# Patient Record
Sex: Female | Born: 1945 | Race: White | Hispanic: No | Marital: Married | Smoking: Never smoker
Health system: Southern US, Community
[De-identification: ages and names within clinical notes are randomized; demographics above are authoritative.]

## PROBLEM LIST (undated history)

## (undated) DIAGNOSIS — H409 Unspecified glaucoma: Secondary | ICD-10-CM

## (undated) DIAGNOSIS — C73 Malignant neoplasm of thyroid gland: Secondary | ICD-10-CM

## (undated) DIAGNOSIS — D649 Anemia, unspecified: Secondary | ICD-10-CM

## (undated) DIAGNOSIS — M1990 Unspecified osteoarthritis, unspecified site: Secondary | ICD-10-CM

## (undated) DIAGNOSIS — I10 Essential (primary) hypertension: Secondary | ICD-10-CM

## (undated) DIAGNOSIS — E039 Hypothyroidism, unspecified: Secondary | ICD-10-CM

## (undated) DIAGNOSIS — M5430 Sciatica, unspecified side: Secondary | ICD-10-CM

## (undated) HISTORY — DX: Sciatica, unspecified side: M54.30

## (undated) HISTORY — PX: TOTAL THYROIDECTOMY: SHX2547

## (undated) HISTORY — PX: OTHER SURGICAL HISTORY: SHX169

## (undated) HISTORY — DX: Unspecified glaucoma: H40.9

## (undated) HISTORY — DX: Anemia, unspecified: D64.9

## (undated) HISTORY — PX: ORTHOPEDIC SURGERY: SHX850

## (undated) HISTORY — PX: APPENDECTOMY: SHX54

---

## 1999-10-14 ENCOUNTER — Other Ambulatory Visit: Admission: RE | Admit: 1999-10-14 | Discharge: 1999-10-14 | Payer: Self-pay | Admitting: Obstetrics and Gynecology

## 2000-01-09 ENCOUNTER — Encounter: Payer: Self-pay | Admitting: Obstetrics and Gynecology

## 2000-01-09 ENCOUNTER — Encounter: Admission: RE | Admit: 2000-01-09 | Discharge: 2000-01-09 | Payer: Self-pay | Admitting: Obstetrics and Gynecology

## 2000-05-27 ENCOUNTER — Emergency Department (HOSPITAL_COMMUNITY): Admission: EM | Admit: 2000-05-27 | Discharge: 2000-05-27 | Payer: Self-pay | Admitting: Emergency Medicine

## 2000-06-09 ENCOUNTER — Emergency Department (HOSPITAL_COMMUNITY): Admission: EM | Admit: 2000-06-09 | Discharge: 2000-06-09 | Payer: Self-pay | Admitting: Emergency Medicine

## 2000-10-08 ENCOUNTER — Encounter: Payer: Self-pay | Admitting: Emergency Medicine

## 2000-10-08 ENCOUNTER — Emergency Department (HOSPITAL_COMMUNITY): Admission: EM | Admit: 2000-10-08 | Discharge: 2000-10-08 | Payer: Self-pay | Admitting: *Deleted

## 2000-10-14 ENCOUNTER — Other Ambulatory Visit: Admission: RE | Admit: 2000-10-14 | Discharge: 2000-10-14 | Payer: Self-pay | Admitting: Obstetrics and Gynecology

## 2001-04-10 ENCOUNTER — Emergency Department (HOSPITAL_COMMUNITY): Admission: EM | Admit: 2001-04-10 | Discharge: 2001-04-11 | Payer: Self-pay | Admitting: Emergency Medicine

## 2001-04-24 ENCOUNTER — Emergency Department (HOSPITAL_COMMUNITY): Admission: EM | Admit: 2001-04-24 | Discharge: 2001-04-24 | Payer: Self-pay | Admitting: Emergency Medicine

## 2001-07-13 ENCOUNTER — Encounter: Payer: Self-pay | Admitting: Obstetrics and Gynecology

## 2001-07-13 ENCOUNTER — Encounter: Admission: RE | Admit: 2001-07-13 | Discharge: 2001-07-13 | Payer: Self-pay | Admitting: Obstetrics and Gynecology

## 2002-07-28 ENCOUNTER — Emergency Department (HOSPITAL_COMMUNITY): Admission: EM | Admit: 2002-07-28 | Discharge: 2002-07-28 | Payer: Self-pay | Admitting: Emergency Medicine

## 2002-10-13 ENCOUNTER — Encounter: Admission: RE | Admit: 2002-10-13 | Discharge: 2002-10-13 | Payer: Self-pay | Admitting: Obstetrics and Gynecology

## 2002-10-13 ENCOUNTER — Encounter: Payer: Self-pay | Admitting: Obstetrics and Gynecology

## 2002-11-24 ENCOUNTER — Encounter: Payer: Self-pay | Admitting: Obstetrics and Gynecology

## 2002-11-24 ENCOUNTER — Ambulatory Visit (HOSPITAL_COMMUNITY): Admission: RE | Admit: 2002-11-24 | Discharge: 2002-11-24 | Payer: Self-pay | Admitting: Obstetrics and Gynecology

## 2002-11-29 ENCOUNTER — Encounter (INDEPENDENT_AMBULATORY_CARE_PROVIDER_SITE_OTHER): Payer: Self-pay | Admitting: *Deleted

## 2002-11-29 ENCOUNTER — Encounter: Payer: Self-pay | Admitting: Obstetrics and Gynecology

## 2002-11-29 ENCOUNTER — Ambulatory Visit (HOSPITAL_COMMUNITY): Admission: RE | Admit: 2002-11-29 | Discharge: 2002-11-29 | Payer: Self-pay | Admitting: Obstetrics and Gynecology

## 2003-01-02 ENCOUNTER — Encounter: Payer: Self-pay | Admitting: General Surgery

## 2003-01-05 ENCOUNTER — Encounter (INDEPENDENT_AMBULATORY_CARE_PROVIDER_SITE_OTHER): Payer: Self-pay | Admitting: *Deleted

## 2003-01-05 ENCOUNTER — Ambulatory Visit (HOSPITAL_COMMUNITY): Admission: RE | Admit: 2003-01-05 | Discharge: 2003-01-06 | Payer: Self-pay | Admitting: General Surgery

## 2003-02-12 ENCOUNTER — Encounter (HOSPITAL_COMMUNITY): Admission: RE | Admit: 2003-02-12 | Discharge: 2003-05-13 | Payer: Self-pay | Admitting: Endocrinology

## 2003-02-16 ENCOUNTER — Encounter: Payer: Self-pay | Admitting: Endocrinology

## 2003-06-07 ENCOUNTER — Ambulatory Visit (HOSPITAL_COMMUNITY): Admission: RE | Admit: 2003-06-07 | Discharge: 2003-06-07 | Payer: Self-pay | Admitting: Endocrinology

## 2003-09-06 ENCOUNTER — Encounter: Admission: RE | Admit: 2003-09-06 | Discharge: 2003-09-06 | Payer: Self-pay | Admitting: Internal Medicine

## 2003-11-06 ENCOUNTER — Encounter: Admission: RE | Admit: 2003-11-06 | Discharge: 2003-11-06 | Payer: Self-pay | Admitting: Obstetrics and Gynecology

## 2003-11-26 ENCOUNTER — Encounter (HOSPITAL_COMMUNITY): Admission: RE | Admit: 2003-11-26 | Discharge: 2004-02-24 | Payer: Self-pay | Admitting: Endocrinology

## 2004-03-04 ENCOUNTER — Encounter (HOSPITAL_COMMUNITY): Admission: RE | Admit: 2004-03-04 | Discharge: 2004-06-02 | Payer: Self-pay | Admitting: Endocrinology

## 2004-09-30 ENCOUNTER — Ambulatory Visit: Payer: Self-pay | Admitting: Internal Medicine

## 2005-03-30 ENCOUNTER — Ambulatory Visit (HOSPITAL_COMMUNITY): Admission: RE | Admit: 2005-03-30 | Discharge: 2005-03-30 | Payer: Self-pay | Admitting: Obstetrics and Gynecology

## 2005-03-30 ENCOUNTER — Encounter (HOSPITAL_COMMUNITY): Admission: RE | Admit: 2005-03-30 | Discharge: 2005-04-16 | Payer: Self-pay | Admitting: Endocrinology

## 2006-04-20 ENCOUNTER — Ambulatory Visit (HOSPITAL_COMMUNITY): Admission: RE | Admit: 2006-04-20 | Discharge: 2006-04-20 | Payer: Self-pay | Admitting: Obstetrics and Gynecology

## 2006-05-03 ENCOUNTER — Encounter (HOSPITAL_COMMUNITY): Admission: RE | Admit: 2006-05-03 | Discharge: 2006-07-27 | Payer: Self-pay | Admitting: Endocrinology

## 2007-05-31 ENCOUNTER — Encounter: Admission: RE | Admit: 2007-05-31 | Discharge: 2007-05-31 | Payer: Self-pay | Admitting: Obstetrics and Gynecology

## 2007-09-21 ENCOUNTER — Ambulatory Visit: Payer: Self-pay | Admitting: Internal Medicine

## 2007-09-21 ENCOUNTER — Encounter: Payer: Self-pay | Admitting: Internal Medicine

## 2007-10-05 ENCOUNTER — Ambulatory Visit: Payer: Self-pay | Admitting: Internal Medicine

## 2008-09-07 ENCOUNTER — Encounter: Admission: RE | Admit: 2008-09-07 | Discharge: 2008-09-07 | Payer: Self-pay | Admitting: Internal Medicine

## 2010-06-07 ENCOUNTER — Encounter: Payer: Self-pay | Admitting: Endocrinology

## 2010-10-03 NOTE — Op Note (Signed)
Diana Fritz, Diana Fritz                        ACCOUNT NO.:  [id number]   MEDICAL RECORD NO.:  [id number]                   PATIENT TYPE:  OIB   LOCATION:  5739                                 FACILITY:  [facility]   PHYSICIAN:  Adolph Pollack, M.D.            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   POSTOPERATIVE DIAGNOSIS:  Papillary thyroid cancer.   PROCEDURE:  Total thyroidectomy.   SURGEON:  Adolph Pollack, M.D.   ASSISTANT:  Lorne Skeens. Hoxworth, M.D.   ANESTHESIA:  General.   INDICATIONS:  Ms. Trabert is a 65 year old female who states she has had  palpable left thyroid mass for many, many years.  She recently underwent  ultrasound guided fine needle aspiration.  This demonstrated findings  consistent with Hurthle cell neoplasm.  Preoperatively, we discussed the  indication for operation.  We talked about left thyroid lobectomy and  possible total thyroidectomy if malignancy was suspected.   DESCRIPTION OF PROCEDURE:  She was seen in the holding area and then brought  to the operating room and placed supine on the operating table.  General  anesthetic was administered.  A roll was placed under her shoulders.  Her  head was slightly extended.  The upper chest and neck were sterilely prepped  and draped.  One fingerbreadth below the clavicle a transverse incision was  made in the neck incising the skin, subcutaneous tissue and the platysma  muscle.  Using the cautery, subplatysmal flaps were raised superior to the  thyroid cartilage and inferiorly to the sternal notch. The strap muscles  were then divided in the midline raphe.  The left lobe of the thyroid gland  and the thyroid mass were exposed by bluntly dissecting away the strap  muscles from them.  There was some mild  inflammatory changes here.  I  started at the inferior pole of the thyroid gland and with dissection down  the thyroid capsule divided small anterior thyroid vessels between clips.  Subsequently, I then approached the superior lobe of the thyroid gland and  staying on the thyroid gland divided the superior thyroid vessels between  clips and ties.  I then was able to identify both the superior and inferior  parathyroid gland and preserve them.  I also identified the recurrent  laryngeal nerve and traced it and preserved it.  The middle thyroid vein was  then divided close to the thyroid capsule between clips.  Subsequently,  using electrocautery I dissected the thyroid gland free from the trachea.  I  then clamped the isthmus at its juncture with the right lobe of the thyroid  gland and then excised the left lobe of the thyroid nodule and the isthmus  and sent it  for frozen section.  I examined the wound for hemostasis and  irrigated and then it was adequate.  I did place a piece of Surgicel in the  area.  I began to close the wound back proximally and the strap muscles with  interrupted Vicryl, and the platysma with interrupted Vicryl suture.  When  Dr. Berneta Levins called back and stated she noted Hurthle cell neoplasm but  also a focus of papillary carcinoma.  At this point, I decided to perform a  total thyroidectomy.   I removed the sutures from the platysmal muscle and the sutures  approximating the strap muscles.  I dissected the strap muscles free from  the right lobe of the thyroid gland bluntly and began inferiorly dividing  the inferior thyroid vessels between clips and mobilized the anterior  thyroid.  I identified an inferior parathyroid gland and tried to preserve  its lateral vascular supply, however, I was unable to.  I subsequently  removed the gland, cut it into multiple small pieces and implanted it in the  belly of the right sternocleidomastoid muscle.   Next, I  approached the superior aspect of the thyroid gland and staying on  the gland divided the vessel between clips and ties.  I started working  inferiorly toward the middle thyroid vein and identified the superior  parathyroid gland which obviously I would preserve along with its lateral  blood supply.  I identified the recurrent laryngeal nerve and traced it and  preserved it as well.  The middle thyroid vessels were then divided between  clips. The thyroid was dissected free from the trachea using electrocautery.  This was also sent to pathology.   The wound was inspected and irrigated.  Hemostasis was noted to be adequate.  I placed the Surgicel in the wound.  I subsequently reexamined the left side  and then again the right side and again hemostasis was adequate.   At this point, I closed the strap muscles using 3-0 Vicryl popoff sutures.  The platysma muscle was closed with interrupted 3-0 Vicryl popoff sutures.  The skin was closed with a combination of Steri-Strips and staples.  A  sterile dressing was applied.   She tolerated the procedure well without any apparent complications.  She  subsequently was taken to the recovery room in satisfactory condition.  Postoperatively, she will have calcium checked, be placed on calcium and  Synthroid.                                               Adolph Pollack, M.D.    Kari Baars  D:  01/06/2003  T:  01/07/2003  Job:  [id number]

## 2013-01-30 ENCOUNTER — Emergency Department (HOSPITAL_COMMUNITY)
Admission: EM | Admit: 2013-01-30 | Discharge: 2013-01-30 | Disposition: A | Discharge status: Home / Self Care | Payer: Medicare Other | Attending: Emergency Medicine | Admitting: Emergency Medicine

## 2013-01-30 ENCOUNTER — Encounter (HOSPITAL_COMMUNITY): Payer: Self-pay | Admitting: *Deleted

## 2013-01-30 DIAGNOSIS — Z9889 Other specified postprocedural states: Secondary | ICD-10-CM | POA: Insufficient documentation

## 2013-01-30 DIAGNOSIS — Y929 Unspecified place or not applicable: Secondary | ICD-10-CM | POA: Insufficient documentation

## 2013-01-30 DIAGNOSIS — Z85850 Personal history of malignant neoplasm of thyroid: Secondary | ICD-10-CM | POA: Insufficient documentation

## 2013-01-30 DIAGNOSIS — S40022A Contusion of left upper arm, initial encounter: Secondary | ICD-10-CM

## 2013-01-30 DIAGNOSIS — Y939 Activity, unspecified: Secondary | ICD-10-CM | POA: Insufficient documentation

## 2013-01-30 DIAGNOSIS — G8911 Acute pain due to trauma: Secondary | ICD-10-CM | POA: Insufficient documentation

## 2013-01-30 DIAGNOSIS — Z880 Allergy status to penicillin: Secondary | ICD-10-CM | POA: Insufficient documentation

## 2013-01-30 DIAGNOSIS — R296 Repeated falls: Secondary | ICD-10-CM | POA: Insufficient documentation

## 2013-01-30 DIAGNOSIS — S40029A Contusion of unspecified upper arm, initial encounter: Secondary | ICD-10-CM | POA: Insufficient documentation

## 2013-01-30 HISTORY — DX: Malignant neoplasm of thyroid gland: C73

## 2013-01-30 NOTE — ED Provider Notes (Signed)
CSN: [id number]     Arrival date & time 01/30/13  2002 History  This chart was scribed for non-physician practitioner Charlestine Night, PA-C, working with Flint Melter, MD by Dorothey Baseman, ED Scribe. This patient was seen in room TR08C/TR08C and the patient's care was started at 8:34 PM.    Chief Complaint  Patient presents with  . Wound Check   The history is provided by the patient. No language interpreter was used.   HPI Comments: Diana Fritz is a 67 y.o. female who presents to the Emergency Department complaining of a wound to her left, upper arm secondary to falling on a piece of glass 1 week ago. She was seen in [place] for the wound and given a tetanus vaccination, received an x-ray, penicillin antibiotics, and sutures to her left medial elbow. She denies the presence of any foreign bodies in the wound. She reports associated ecchymosis and pain to the area that has been progressively worsening. She denies any anticoagulant use. Patient is worried about hematoma.   Past Medical History  Diagnosis Date  . Thyroid cancer    Past Surgical History  Procedure Laterality Date  . Total thyroidectomy    . Orthopedic surgery     History reviewed. No pertinent family history. History  Substance Use Topics  . Smoking status: Never Smoker   . Smokeless tobacco: Not on file  . Alcohol Use: Yes     Comment: occasionally   OB History   Grav Para Term Preterm Abortions TAB SAB Ect Mult Living                 Review of Systems  A complete 10 system review of systems was obtained and all systems are negative except as noted in the HPI and PMH.   Allergies  Penicillins  Home Medications  No current outpatient prescriptions on file.  Triage Vitals: BP 175/102  Pulse 80  Temp(Src) 97.5 F (36.4 C) (Oral)  Resp 16  Ht 5\' 6"  (1.676 m)  Wt 165 lb (74.844 kg)  BMI 26.64 kg/m2  SpO2 98%  Physical Exam  Nursing note and vitals reviewed. Constitutional: She is oriented to  person, place, and time. She appears well-developed and well-nourished. No distress.  HENT:  Head: Normocephalic and atraumatic.  Eyes: Conjunctivae are normal.  Neck: Normal range of motion. Neck supple.  Musculoskeletal: Normal range of motion.  No tenderness to palpation to left arm.  Neurological: She is alert and oriented to person, place, and time.  Skin: Skin is warm and dry.  Ecchymosis to the left upper arm.  Psychiatric: She has a normal mood and affect. Her behavior is normal.    ED Course  Procedures (including critical care time)   DIAGNOSTIC STUDIES: Oxygen Saturation is 98% on room air, normal by my interpretation.    COORDINATION OF CARE: 8:37PM- Discussed that suspicion of an infection is low at this time, but advised patient to continue the antibiotic course. Discussed treatment plan with patient at bedside and patient verbalized agreement.     MDM  I personally performed the services described in this documentation, which was scribed in my presence. The recorded information has been reviewed and is accurate.     Carlyle Dolly, PA-C 01/30/13 [id number]

## 2013-01-30 NOTE — ED Provider Notes (Signed)
  Face-to-face evaluation   History: She fell 4 days ago, injuring the left arm. She is here for a checkup  Physical exam: Alert, calm, cooperative. Left arm has a healing wound on the proximal forearm. There is diffuse bruising over the posterior upper arm and volar forearm. There is no pain on palpation and there is normal. Range of motion of the shoulder, elbow, and wrist. Is no sign of compartment syndrome.  Assessment: Healing bruise and laceration.  Medical screening examination/treatment/procedure(s) were conducted as a shared visit with non-physician practitioner(s) and myself.  I personally evaluated the patient during the encounter  Flint Melter, MD 02/06/13 [phone]

## 2013-01-30 NOTE — ED Notes (Signed)
Pt reports while in [place] she fell and injured her left arm on a glass x1 week ago - pt received sutures to her left medial elbow - pt concerned about a hematoma to left arm.

## 2013-01-31 NOTE — ED Provider Notes (Deleted)
Medical screening examination/treatment/procedure(s) were performed by non-physician practitioner and as supervising physician I was immediately available for consultation/collaboration.  Flint Melter, MD 01/31/13 [phone]

## 2013-02-06 ENCOUNTER — Other Ambulatory Visit: Payer: Self-pay

## 2013-02-06 DIAGNOSIS — Z1231 Encounter for screening mammogram for malignant neoplasm of breast: Secondary | ICD-10-CM

## 2013-02-23 ENCOUNTER — Ambulatory Visit
Admission: RE | Admit: 2013-02-23 | Discharge: 2013-02-23 | Disposition: A | Discharge status: Home / Self Care | Payer: Medicare Other | Source: Ambulatory Visit

## 2013-02-23 DIAGNOSIS — Z1231 Encounter for screening mammogram for malignant neoplasm of breast: Secondary | ICD-10-CM

## 2014-05-21 DIAGNOSIS — Z0000 Encounter for general adult medical examination without abnormal findings: Secondary | ICD-10-CM | POA: Diagnosis not present

## 2014-05-21 DIAGNOSIS — Z124 Encounter for screening for malignant neoplasm of cervix: Secondary | ICD-10-CM | POA: Diagnosis not present

## 2014-05-21 DIAGNOSIS — Z01419 Encounter for gynecological examination (general) (routine) without abnormal findings: Secondary | ICD-10-CM | POA: Diagnosis not present

## 2014-05-22 DIAGNOSIS — Z124 Encounter for screening for malignant neoplasm of cervix: Secondary | ICD-10-CM | POA: Diagnosis not present

## 2014-05-28 DIAGNOSIS — H402224 Chronic angle-closure glaucoma, left eye, indeterminate stage: Secondary | ICD-10-CM | POA: Diagnosis not present

## 2014-05-31 DIAGNOSIS — Z1231 Encounter for screening mammogram for malignant neoplasm of breast: Secondary | ICD-10-CM | POA: Diagnosis not present

## 2014-06-01 DIAGNOSIS — K5900 Constipation, unspecified: Secondary | ICD-10-CM | POA: Diagnosis not present

## 2014-08-14 DIAGNOSIS — R5383 Other fatigue: Secondary | ICD-10-CM | POA: Diagnosis not present

## 2014-08-14 DIAGNOSIS — E039 Hypothyroidism, unspecified: Secondary | ICD-10-CM | POA: Diagnosis not present

## 2014-08-14 DIAGNOSIS — Z0000 Encounter for general adult medical examination without abnormal findings: Secondary | ICD-10-CM | POA: Diagnosis not present

## 2014-08-14 DIAGNOSIS — Z1389 Encounter for screening for other disorder: Secondary | ICD-10-CM | POA: Diagnosis not present

## 2014-08-14 DIAGNOSIS — R1032 Left lower quadrant pain: Secondary | ICD-10-CM | POA: Diagnosis not present

## 2014-08-14 DIAGNOSIS — Z131 Encounter for screening for diabetes mellitus: Secondary | ICD-10-CM | POA: Diagnosis not present

## 2014-08-14 DIAGNOSIS — I70219 Atherosclerosis of native arteries of extremities with intermittent claudication, unspecified extremity: Secondary | ICD-10-CM | POA: Diagnosis not present

## 2014-08-14 DIAGNOSIS — E780 Pure hypercholesterolemia: Secondary | ICD-10-CM | POA: Diagnosis not present

## 2014-08-14 DIAGNOSIS — R002 Palpitations: Secondary | ICD-10-CM | POA: Diagnosis not present

## 2014-08-14 DIAGNOSIS — R42 Dizziness and giddiness: Secondary | ICD-10-CM | POA: Diagnosis not present

## 2014-08-14 DIAGNOSIS — I10 Essential (primary) hypertension: Secondary | ICD-10-CM | POA: Diagnosis not present

## 2014-08-14 DIAGNOSIS — H9113 Presbycusis, bilateral: Secondary | ICD-10-CM | POA: Diagnosis not present

## 2014-08-20 DIAGNOSIS — Z961 Presence of intraocular lens: Secondary | ICD-10-CM | POA: Diagnosis not present

## 2014-08-20 DIAGNOSIS — H2511 Age-related nuclear cataract, right eye: Secondary | ICD-10-CM | POA: Diagnosis not present

## 2014-08-30 DIAGNOSIS — Z961 Presence of intraocular lens: Secondary | ICD-10-CM | POA: Diagnosis not present

## 2014-08-30 DIAGNOSIS — H53002 Unspecified amblyopia, left eye: Secondary | ICD-10-CM | POA: Diagnosis not present

## 2014-08-30 DIAGNOSIS — H402224 Chronic angle-closure glaucoma, left eye, indeterminate stage: Secondary | ICD-10-CM | POA: Diagnosis not present

## 2014-10-18 ENCOUNTER — Encounter: Payer: Self-pay | Admitting: Internal Medicine

## 2017-08-23 ENCOUNTER — Encounter: Payer: Self-pay | Admitting: Internal Medicine

## 2019-06-24 ENCOUNTER — Ambulatory Visit: Payer: Medicare Other | Attending: Internal Medicine

## 2019-06-24 DIAGNOSIS — Z23 Encounter for immunization: Secondary | ICD-10-CM | POA: Insufficient documentation

## 2019-06-24 NOTE — Progress Notes (Signed)
   Covid-19 Vaccination Clinic  Name:  LARIN GRAVETT    MRN: [id number] DOB: [birth year]  06/24/2019  Ms. Coughlin was observed post Covid-19 immunization for 15 minutes without incidence. She was provided with Vaccine Information Sheet and instruction to access the V-Safe system.   Ms. Gluck was instructed to call 911 with any severe reactions post vaccine: Marland Kitchen Difficulty breathing  . Swelling of your face and throat  . A fast heartbeat  . A bad rash all over your body  . Dizziness and weakness    Immunizations Administered    Name Date Dose VIS Date Route   Pfizer COVID-19 Vaccine 06/24/2019 11:35 AM 0.3 mL 04/28/2019 Intramuscular   Manufacturer: [place]   Lot: [id number]   [place]: [id number]

## 2019-07-12 ENCOUNTER — Ambulatory Visit: Payer: Self-pay

## 2019-07-18 ENCOUNTER — Ambulatory Visit: Payer: Medicare Other | Attending: Internal Medicine

## 2019-07-18 DIAGNOSIS — Z23 Encounter for immunization: Secondary | ICD-10-CM | POA: Insufficient documentation

## 2019-07-18 NOTE — Progress Notes (Signed)
   Covid-19 Vaccination Clinic  Name:  LUNAFREYA REDEKER    MRN: [id number] DOB: 07-05-1945  07/18/2019  Ms. Anfinson was observed post Covid-19 immunization for 15 minutes without incident. She was provided with Vaccine Information Sheet and instruction to access the V-Safe system.   Ms. Crowe was instructed to call 911 with any severe reactions post vaccine: Marland Kitchen Difficulty breathing  . Swelling of face and throat  . A fast heartbeat  . A bad rash all over body  . Dizziness and weakness   Immunizations Administered    Name Date Dose VIS Date Route   Pfizer COVID-19 Vaccine 07/18/2019 12:02 PM 0.3 mL 04/28/2019 Intramuscular   Manufacturer: [place]   Lot: [id number]   [place]: [id number]

## 2019-07-19 ENCOUNTER — Ambulatory Visit: Payer: Medicare Other

## 2021-10-30 ENCOUNTER — Other Ambulatory Visit: Payer: Self-pay | Admitting: Internal Medicine

## 2021-10-30 ENCOUNTER — Ambulatory Visit
Admission: RE | Admit: 2021-10-30 | Discharge: 2021-10-30 | Disposition: A | Discharge status: Home / Self Care | Payer: Medicare Other | Source: Ambulatory Visit | Attending: Internal Medicine | Admitting: Internal Medicine

## 2021-10-30 DIAGNOSIS — W19XXXA Unspecified fall, initial encounter: Secondary | ICD-10-CM

## 2021-10-30 DIAGNOSIS — R0781 Pleurodynia: Secondary | ICD-10-CM

## 2022-03-18 ENCOUNTER — Encounter: Payer: Self-pay | Admitting: Internal Medicine

## 2022-04-29 ENCOUNTER — Encounter: Payer: Self-pay | Admitting: Internal Medicine

## 2022-04-29 ENCOUNTER — Ambulatory Visit: Payer: Medicare Other | Admitting: Internal Medicine

## 2022-04-29 DIAGNOSIS — D509 Iron deficiency anemia, unspecified: Secondary | ICD-10-CM | POA: Diagnosis not present

## 2022-04-29 DIAGNOSIS — Z1211 Encounter for screening for malignant neoplasm of colon: Secondary | ICD-10-CM | POA: Diagnosis not present

## 2022-04-29 MED ORDER — NA SULFATE-K SULFATE-MG SULF 17.5-3.13-1.6 GM/177ML PO SOLN: 1.0000 | Freq: Once | ORAL | 0 refills | Status: AC

## 2022-04-29 NOTE — Patient Instructions (Addendum)
If you are age 75 or older, your body mass index should be between 23-30. Your Body mass index is 28.04 kg/m. If this is out of the aforementioned range listed, please consider follow up with your Primary Care Provider. ________________________________________________________  The [facility] providers would like to encourage you to use [facility] to communicate with providers for non-urgent requests or questions.  Due to long hold times on the telephone, sending your provider a message by [facility] may be a faster and more efficient way to get a response.  Please allow 48 business hours for a response.  Please remember that this is for non-urgent requests.  _______________________________________________________  Dennis Bast have been scheduled for an endoscopy and colonoscopy. Please follow the written instructions given to you at your visit today. Please pick up your prep supplies at the pharmacy within the next 1-3 days. If you use inhalers (even only as needed), please bring them with you on the day of your procedure.  Due to recent changes in healthcare laws, you may see the results of your imaging and laboratory studies on MyChart before your provider has had a chance to review them.  We understand that in some cases there may be results that are confusing or concerning to you. Not all laboratory results come back in the same time frame and the provider may be waiting for multiple results in order to interpret others.  Please give [place] 48 hours in order for your provider to thoroughly review all the results before contacting the office for clarification of your results.   Thank you for entrusting me with your care and choosing [facility].  Dr Lorenso Courier

## 2022-04-29 NOTE — Progress Notes (Signed)
Chief Complaint: IDA  HPI : 76 year old female with history of thyroid cancer s/p resection, anemia, sciatica and glaucoma presents with IDA  She had nosebleeds last year, which were quite frequent. These nosebleeds resolved 2 months ago after a cauterization procedure. Denies blood in stools, changes in bowel habits, unintentional weight loss, diarrhea, or constipation. Denies GERD, dysphagia, N&V, or ab pain. She has 2 BMs per day on average. Denies family history of GI issues. About 4-5 years ago she had an appendectomy. Last colonoscopy was in 2009, which was normal. Denies prior EGD. Denies menstrual periods.   Past Medical History:  Diagnosis Date   Anemia    Glaucoma    Sciatic leg pain    Thyroid cancer ([place])    Past Surgical History:  Procedure Laterality Date   ORTHOPEDIC SURGERY     TOTAL THYROIDECTOMY     Family History  Problem Relation Age of Onset   Colon cancer Neg Hx    Esophageal cancer Neg Hx    Social History   Tobacco Use   Smoking status: Never   Smokeless tobacco: Never  Vaping Use   Vaping Use: Never used  Substance Use Topics   Alcohol use: Yes    Comment: occasionally   Drug use: No   Current Outpatient Medications  Medication Sig Dispense Refill   Acetaminophen (TYLENOL PO) Take 1 tablet by mouth as needed. Rapid Release tablet     CALCIUM PO Take 1 tablet by mouth as needed.     Cholecalciferol (VITAMIN D-3 PO) Take 1 tablet by mouth daily as needed.     dorzolamide-timolol (COSOPT) 2-0.5 % ophthalmic solution Place 1 drop into the left eye 2 (two) times daily.     HYDROcodone-acetaminophen (NORCO/VICODIN) 5-325 MG tablet Take 0.5-1 tablets by mouth as needed.     levothyroxine (SYNTHROID) 88 MCG tablet Take 88 mcg by mouth daily before breakfast.     lisinopril-hydrochlorothiazide (ZESTORETIC) 10-12.5 MG tablet Take 0.5 tablets by mouth daily.     Multiple Vitamin (MULTIVITAMIN) capsule Take 1 capsule by mouth as needed.     Omega-3 Fatty  Acids (FISH OIL PO) Take 1 tablet by mouth as needed.     sertraline (ZOLOFT) 25 MG tablet Take 25 mg by mouth as needed.     simvastatin (ZOCOR) 20 MG tablet Take 1 tablet by mouth daily.     No current facility-administered medications for this visit.   No Active Allergies   Review of Systems: All systems reviewed and negative except where noted in HPI.   Physical Exam: BP 126/76   Pulse 81   Ht '5\' 5"'$  (1.651 m)   Wt 168 lb 8 oz (76.4 kg)   BMI 28.04 kg/m  Constitutional: Pleasant,well-developed, female in no acute distress. HEENT: Normocephalic and atraumatic. Conjunctivae are normal. No scleral icterus. Cardiovascular: Normal rate, regular rhythm.  Pulmonary/chest: Effort normal and breath sounds normal. No wheezing, rales or rhonchi. Abdominal: Soft, nondistended, nontender. Bowel sounds active throughout. There are no masses palpable. No hepatomegaly. Extremities: No edema Neurological: Alert and oriented to person place and time. Skin: Skin is warm and dry. No rashes noted. Psychiatric: Normal mood and affect. Behavior is normal.  Labs 02/2022: CBC with low Hb of 11.3. Iron levels with low ferritin of 8.  Colonoscopy 10/05/07:  Comments: difficult exam due to tortuosity Assessment Normal examination.  Comments: no polyps., no diverticuli, tortuous colon  ASSESSMENT AND PLAN: IDA Colon cancer screening Patient presents with mild IDA  that was discovered by her PCP. She is otherwise asymptomatic but would be due for colon cancer screening since her last colonoscopy was in 2009. Will plan for EGD and colonoscopy for further evaluation of her IDA. - EGD/colonoscopy [facility]  Christia Reading, MD  I spent 46 minutes of time, including in depth chart review, independent review of results as outlined above, communicating results with the patient directly, face-to-face time with the patient, coordinating care, ordering studies and medications as appropriate, and documentation.

## 2022-06-18 ENCOUNTER — Encounter: Payer: Self-pay | Admitting: Internal Medicine

## 2022-06-25 ENCOUNTER — Encounter: Payer: Medicare Other | Admitting: Internal Medicine

## 2022-06-25 ENCOUNTER — Telehealth: Payer: Self-pay | Admitting: Gastroenterology

## 2022-06-25 ENCOUNTER — Telehealth: Payer: Self-pay | Admitting: *Deleted

## 2022-06-25 NOTE — Telephone Encounter (Signed)
Called the patient back per Dr. Lorenso Courier. She had called Dr. Fuller Plan who was on call last night and she started vomiting after she started her prep. "I wasn't able to keep any thing down." I put in a 2 month recall as the patient wanted to call us back to reschedule she didn't want to do it at this time.

## 2022-06-25 NOTE — Telephone Encounter (Signed)
Pt relates vomiting entire bowel prep this evening and she does not want to proceed with another prep or with colonoscopy tomorrow. She is cancelling.

## 2022-11-27 IMAGING — CR DG RIBS W/ CHEST 3+V*R*
3 series · 3 of 3 positions shown · non-contrast
Comparison: None Available.

CLINICAL DATA: fall   right rib pain

EXAM:
RIGHT RIBS AND CHEST - 3+ VIEW

[w chest pa]
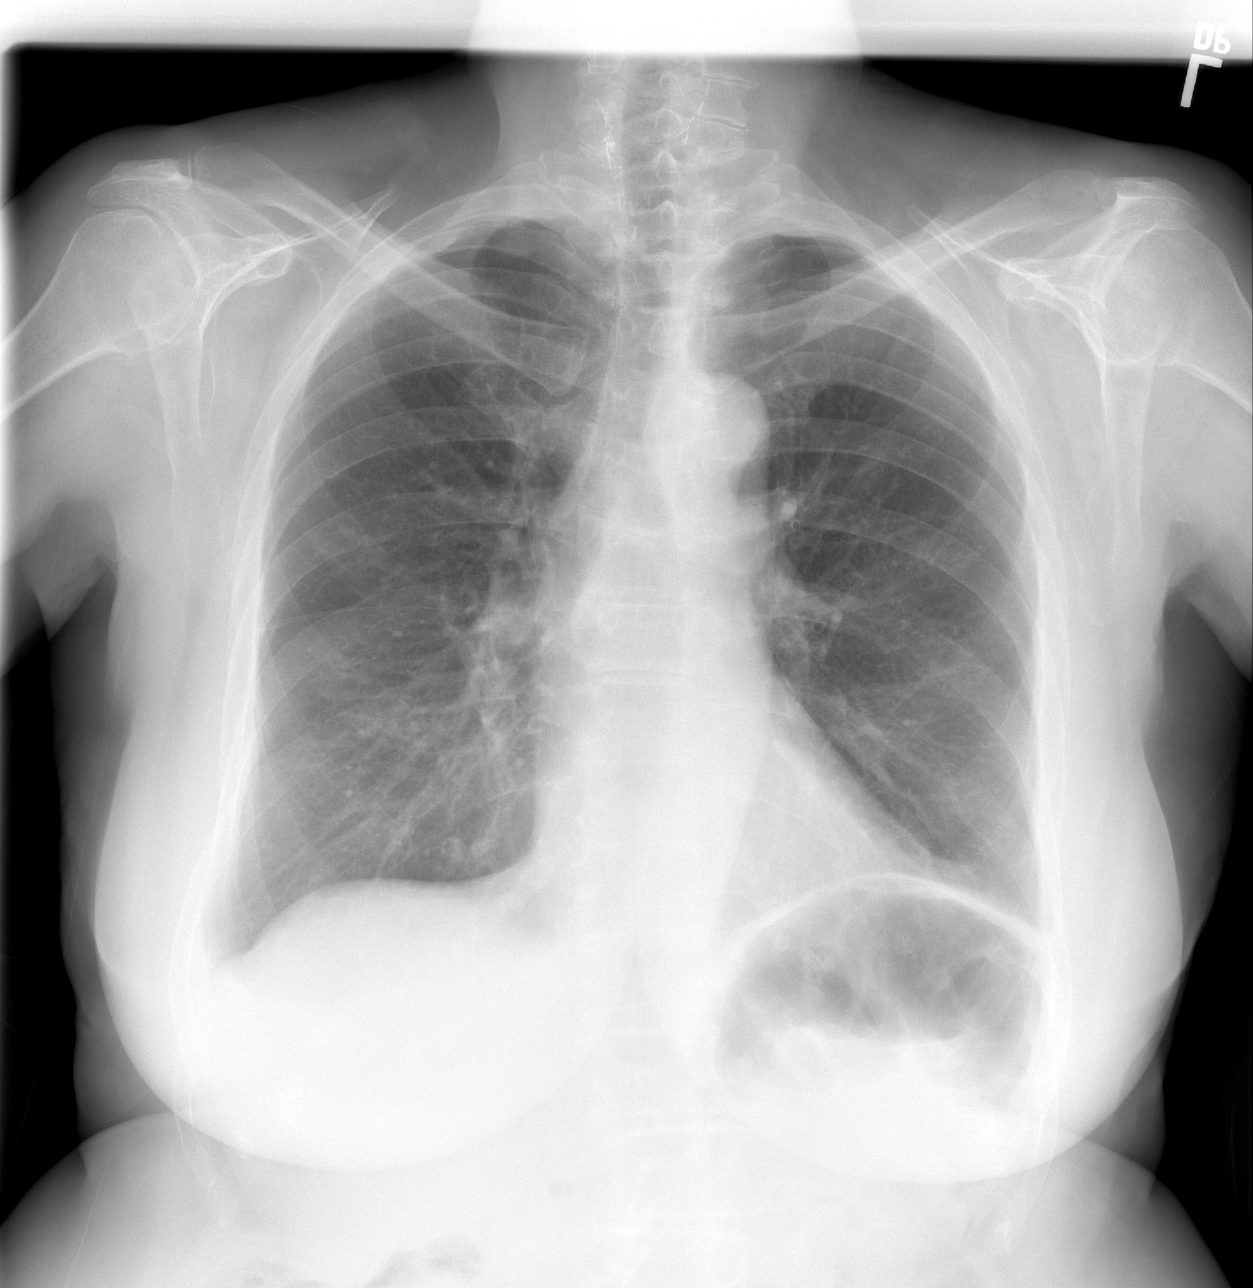

[w ribs ap/pa lower right *]
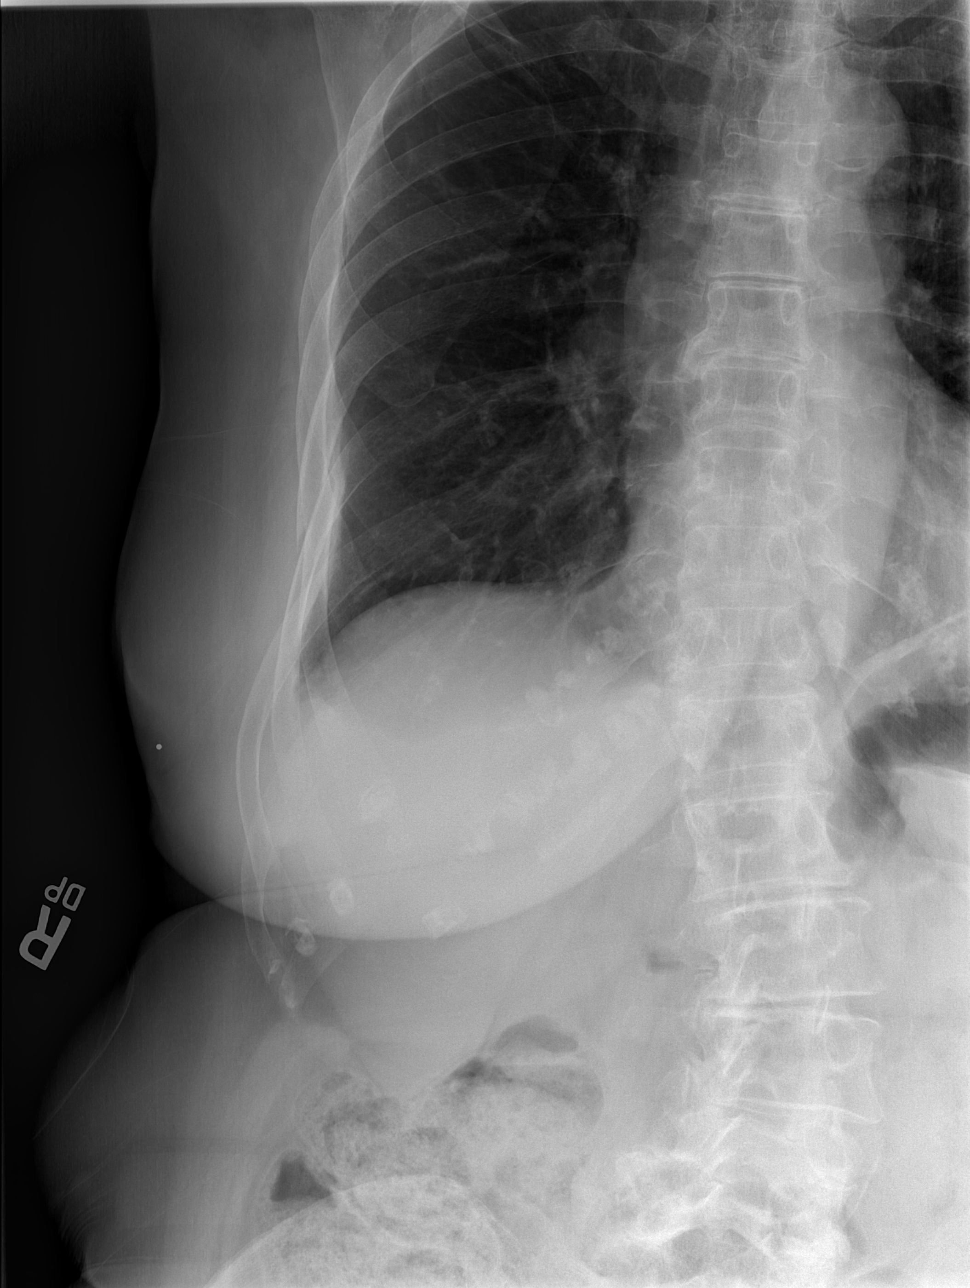

[w ribs oblique right *]
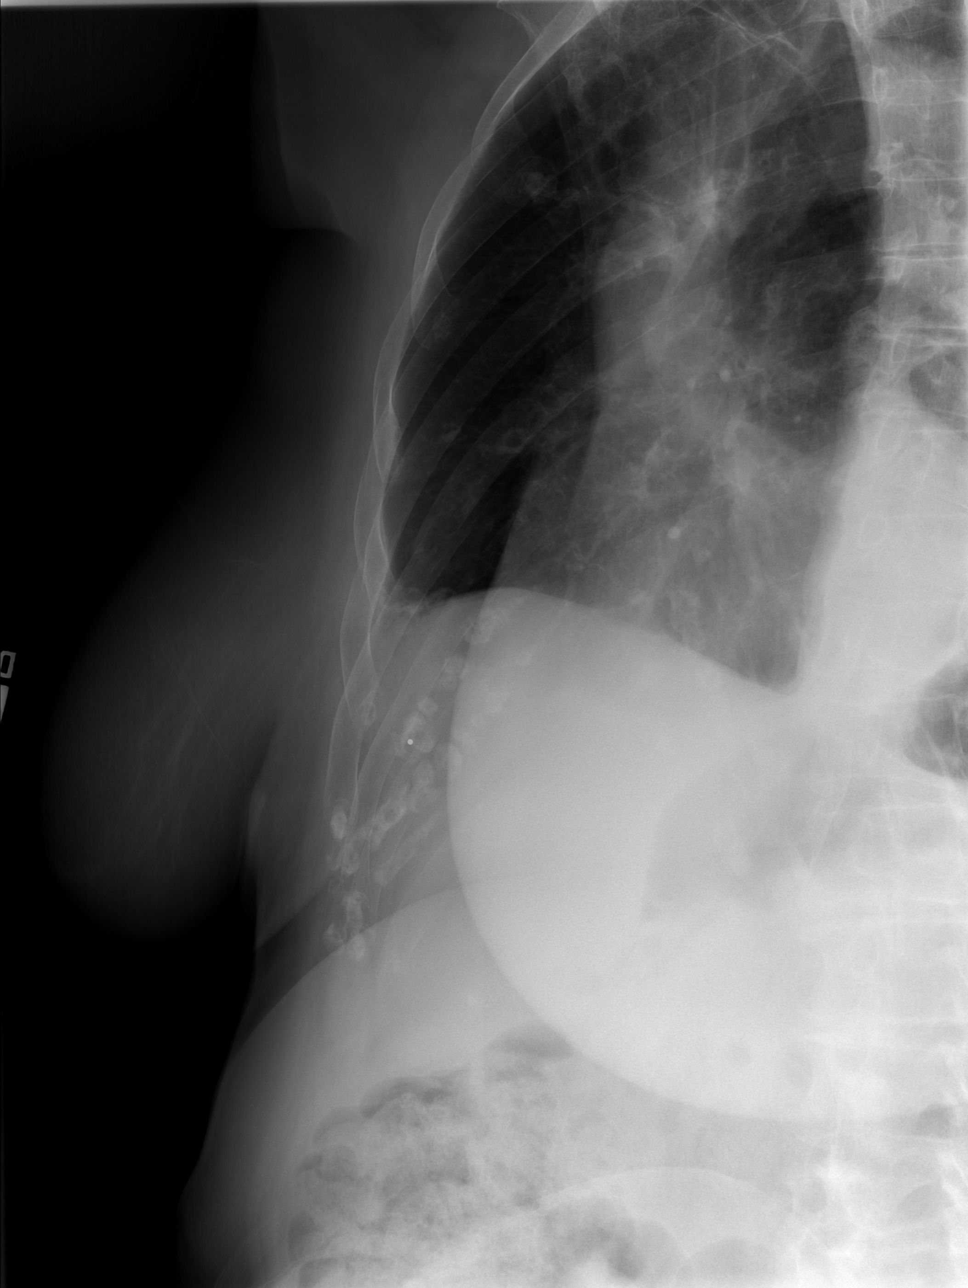

[3 of 3 positions shown; findings below may reference images not displayed]

FINDINGS: Punctate BB marker overlies the lower right chest. No acute
displaced fracture or other bone lesions are seen involving the
ribs.

The heart and mediastinal contours are within normal limits.

No focal consolidation. No pulmonary edema. No pleural effusion. No
pneumothorax.
IMPRESSION: 1. No acute displaced right rib fracture. Please note, nondisplaced
rib fractures may be occult on radiograph.
2. No acute cardiopulmonary abnormality.

## 2023-01-01 ENCOUNTER — Encounter: Payer: Self-pay | Admitting: Internal Medicine

## 2024-05-24 NOTE — H&P (Signed)
 TOTAL HIP ADMISSION H&P  Patient is admitted for left total hip arthroplasty.  Subjective:  Chief Complaint: Left hip pain  HPI: Diana Fritz, 79 y.o. female, has a history of pain and functional disability in the left hip due to arthritis and patient has failed non-surgical conservative treatments for greater than 12 weeks to include NSAID's and/or analgesics, use of assistive devices, and activity modification. Onset of symptoms was gradual, starting several years ago with gradually worsening course since that time. The patient noted no past surgery on the left hip. Patient currently rates pain in the left hip at 8 out of 10 with activity. Patient has worsening of pain with activity and weight bearing and pain that interfers with activities of daily living. Patient has evidence of advanced bone-on-bone arthritis with large marginal osteophytes and subchondral cysts by imaging studies. This condition presents safety issues increasing the risk of falls.There is no current active infection.   Past Medical History:  Diagnosis Date   Anemia    Glaucoma    Sciatic leg pain    Thyroid  cancer (HCC)     Past Surgical History:  Procedure Laterality Date   ORTHOPEDIC SURGERY     TOTAL THYROIDECTOMY      Prior to Admission medications  Medication Sig Start Date End Date Taking? Authorizing Provider  Acetaminophen (TYLENOL PO) Take 1 tablet by mouth as needed. Rapid Release tablet    [provider]  CALCIUM PO Take 1 tablet by mouth as needed.    [provider]  Cholecalciferol (VITAMIN D-3 PO) Take 1 tablet by mouth daily as needed.    [provider]  dorzolamide-timolol (COSOPT) 2-0.5 % ophthalmic solution Place 1 drop into the left eye 2 (two) times daily.    [provider]  HYDROcodone-acetaminophen (NORCO/VICODIN) 5-325 MG tablet Take 0.5-1 tablets by mouth as needed.    [provider]  levothyroxine (SYNTHROID) 88 MCG tablet Take 88 mcg  by mouth daily before breakfast.    [provider]  lisinopril-hydrochlorothiazide (ZESTORETIC) 10-12.5 MG tablet Take 0.5 tablets by mouth daily. 02/19/16   [provider]  Multiple Vitamin (MULTIVITAMIN) capsule Take 1 capsule by mouth as needed.    [provider]  Omega-3 Fatty Acids (FISH OIL PO) Take 1 tablet by mouth as needed.    [provider]  sertraline (ZOLOFT) 25 MG tablet Take 25 mg by mouth as needed.    [provider]  simvastatin (ZOCOR) 20 MG tablet Take 1 tablet by mouth daily.    [provider]    Allergies[1]  Social History   Socioeconomic History   Marital status: Married    Spouse name: Not on file   Number of children: Not on file   Years of education: Not on file   Highest education level: Not on file  Occupational History   Not on file  Tobacco Use   Smoking status: Never   Smokeless tobacco: Never  Vaping Use   Vaping status: Never Used  Substance and Sexual Activity   Alcohol  use: Yes    Comment: occasionally   Drug use: No   Sexual activity: Not on file  Other Topics Concern   Not on file  Social History Narrative   Not on file   Social Drivers of Health   Tobacco Use: Low Risk (03/24/2024)   Received from [facility]   Patient History    Smoking Tobacco Use: Never    Smokeless Tobacco Use: Never  Passive Exposure: Not on file  Financial Resource Strain: Not on file  Food Insecurity: Low Risk (05/03/2023)   Received from [facility]   Epic    Within the past 12 months, you worried that your food would run out before you got money to buy more: Never true    Within the past 12 months, the food you bought just didn't last and you didn't have money to get more. : Never true  Transportation Needs: No Transportation Needs (05/03/2023)   Received from [facility]    In the past 12 months, has lack of reliable transportation kept you from medical appointments,  meetings, work or from getting things needed for daily living? : No  Physical Activity: Not on file  Stress: Not on file  Social Connections: Not on file  Intimate Partner Violence: Not on file  Depression (EYV7-0): Not on file  Alcohol  Screen: Not on file  Housing: Low Risk (05/03/2023)   Received from [facility]   Epic    What is your living situation today?: I have a steady place to live    Think about the place you live. Do you have problems with any of the following? Choose all that apply:: None/None on this list  Utilities: Low Risk (05/03/2023)   Received from [facility]   Utilities    In the past 12 months has the electric, gas, oil, or water company threatened to shut off services in your home? : No  Health Literacy: Not on file    Tobacco Use: Low Risk (03/24/2024)   Received from [facility]   Patient History    Smoking Tobacco Use: Never    Smokeless Tobacco Use: Never    Passive Exposure: Not on file   Social History   Substance and Sexual Activity  Alcohol  Use Yes   Comment: occasionally    Family History  Problem Relation Age of Onset   Colon cancer Neg Hx    Esophageal cancer Neg Hx     ROS   Objective:  Physical Exam: - Well-developed female alert and oriented in no apparent distress    - Left hip: Flexion to 100, minimal internal rotation, external rotation 10-20, abduction 20. Significant limitation in range of motion with loss of rotation. Mild tenderness to palpation over the greater trochanter.    - Right hip: Flexion to 120, internal rotation 30, external rotation 40, abduction 40. No pain or limitation in range of motion.    IMAGING:   X-rays of the left hip, AP and lateral views, dated August 2025 from an outside facility, demonstrate advanced bone-on-bone arthritis with large marginal osteophytes and subchondral cysts.  Assessment/Plan:  End stage arthritis, left hip  The patient history, physical examination, clinical  judgement of the provider and imaging studies are consistent with end stage degenerative joint disease of the left hip and total hip arthroplasty is deemed medically necessary. The treatment options including medical management, injection therapy, arthroscopy and arthroplasty were discussed at length. The risks and benefits of total hip arthroplasty were presented and reviewed. The risks due to aseptic loosening, infection, stiffness, dislocation/subluxation, thromboembolic complications and other imponderables were discussed. The patient acknowledged the explanation, agreed to proceed with the plan and consent was signed. Patient is being admitted for inpatient treatment for surgery, pain control, PT, OT, prophylactic antibiotics, VTE prophylaxis, progressive ambulation and ADLs and discharge planning.The patient is planning to be discharged home.   Patient's anticipated LOS is less than 2  midnights, meeting these requirements: - Younger than 68 - Lives within 1 hour of care - Has a competent adult at home to recover with post-op recover - NO history of  - Chronic pain requiring opiods  - Diabetes  - Coronary Artery Disease  - Heart failure  - Heart attack  - Stroke  - DVT/VTE  - Cardiac arrhythmia  - Respiratory Failure/COPD  - Renal failure  - Anemia  - Advanced Liver disease  Therapy Plans: HEP Disposition: Home with daughter Planned DVT Prophylaxis: Aspirin 81mg  BID DME Needed: None PCP: Tanda Rummer, MD (clearance received) TXA: IV Allergies: NKDA Anesthesia Concerns: None BMI: 28.6 Last HgbA1c: Not diabetic Pain Regimen: Hydrocodone, tramadol [facility]acy: [facility], [address]  Other: -Remote hx thyroid  cancer  - Patient was instructed on what medications to stop prior to surgery. - Follow-up visit in 2 weeks with Dr. Melodi - Begin physical therapy following surgery - Pre-operative lab work as pre-surgical testing - Prescriptions will be provided in hospital at time  of discharge  Corean Sender, PA-C Orthopedic Surgery [facility] [place]      [1] No Active Allergies

## 2024-05-29 NOTE — Patient Instructions (Addendum)
 SURGICAL WAITING ROOM VISITATION Patients having surgery or a procedure may have no more than 2 support people in the waiting area - these visitors may rotate.    Children under the age of 32 will not be allowed to visit due to the increase in respiratory illness  Children under the age of 66 must have an adult with them who is not the patient.  If the patient needs to stay at the hospital during part of their recovery, the visitor guidelines for inpatient rooms apply. Pre-op nurse will coordinate an appropriate time for 1 support person to accompany patient in pre-op.  This support person may not rotate.    Please refer to the [facility] website for the visitor guidelines for Inpatients (after your surgery is over and you are in a regular room).       Your procedure is scheduled on: 06-07-24   Report to [facility] Main Entrance    Report to admitting at 7:15 AM   Call this number if you have problems the morning of surgery [phone]   Do not eat food :After Midnight.   After Midnight you may have the following liquids until 6:45 AM DAY OF SURGERY  Water Non-Citrus Juices (without pulp, NO RED-Apple, White grape, White cranberry) Black Coffee (NO MILK/CREAM OR CREAMERS, sugar ok)  Clear Tea (NO MILK/CREAM OR CREAMERS, sugar ok) regular and decaf                             Plain Jell-O (NO RED)                                           Fruit ices (not with fruit pulp, NO RED)                                     Popsicles (NO RED)                                                               Sports drinks like Gatorade (NO RED)                   The day of surgery:  Drink ONE (1) Pre-Surgery Clear Ensure by 6:45 AM the morning of surgery. Drink in one sitting. Do not sip.  This drink was given to you during your hospital  pre-op appointment visit. Nothing else to drink after completing the Pre-Surgery Clear Ensure.          If you have questions, please contact  your surgeons office.   FOLLOW  ANY ADDITIONAL PRE OP INSTRUCTIONS YOU RECEIVED FROM YOUR SURGEON'S OFFICE!!!     Oral Hygiene is also important to reduce your risk of infection.                                    Remember - BRUSH YOUR TEETH THE MORNING OF SURGERY WITH YOUR REGULAR TOOTHPASTE   Do NOT smoke after Midnight  Take these medicines the morning of surgery with A SIP OF WATER:    Levothyroxine   Sertraline   Simvastatin   Eyedrops if needed  Stop all vitamins and herbal supplements 7 days before surgery  Bring CPAP mask and tubing day of surgery.                              You may not have any metal on your body including hair pins, jewelry, and body piercing             Do not wear make-up, lotions, powders, perfumes, or deodorant  Do not wear nail polish including gel and S&S, artificial/acrylic nails, or any other type of covering on natural nails including finger and toenails. If you have artificial nails, gel coating, etc. that needs to be removed by a nail salon please have this removed prior to surgery or surgery may need to be canceled/ delayed if the surgeon/ anesthesia feels like they are unable to be safely monitored.   Do not shave  48 hours prior to surgery.        Do not bring valuables to the hospital. [facility] IS NOT RESPONSIBLE   FOR VALUABLES.   Contacts, dentures or bridgework may not be worn into surgery.   Bring small overnight bag day of surgery.   DO NOT BRING YOUR HOME MEDICATIONS TO THE HOSPITAL. PHARMACY WILL DISPENSE MEDICATIONS LISTED ON YOUR MEDICATION LIST TO YOU DURING YOUR ADMISSION IN THE HOSPITAL!    Special Instructions: Bring a copy of your healthcare power of attorney and living will documents the day of surgery if you haven't scanned them before.              Please read over the following fact sheets you were given: IF YOU HAVE QUESTIONS ABOUT YOUR PRE-OP INSTRUCTIONS PLEASE CALL [phone] [facility]  If you received a  COVID test during your pre-op visit  it is requested that you wear a mask when out in public, stay away from anyone that may not be feeling well and notify your surgeon if you develop symptoms. If you test positive for Covid or have been in contact with anyone that has tested positive in the last 10 days please notify you surgeon.   Pre-operative 4 CHG Bath Instructions  DYNA-Hex 4 Chlorhexidine Gluconate 4% Solution Antiseptic 4 fl. oz   You can play a key role in reducing the risk of infection after surgery. Your skin needs to be as free of germs as possible. You can reduce the number of germs on your skin by washing with CHG (chlorhexidine gluconate) soap before surgery. CHG is an antiseptic soap that kills germs and continues to kill germs even after washing.   DO NOT use if you have an allergy to chlorhexidine/CHG or antibacterial soaps. If your skin becomes reddened or irritated, stop using the CHG and notify one of our RNs at   Please shower with the CHG soap starting 4 days before surgery using the following schedule:     Please keep in mind the following:  DO NOT shave, including legs and underarms, starting the day of your first shower.   You may shave your face at any point before/day of surgery.  Place clean sheets on your bed the day you start using CHG soap. Use a clean washcloth (not used since being washed) for each shower. DO NOT sleep with pets once you start  using the CHG.  CHG Shower Instructions:  If you choose to wash your hair and private area, wash first with your normal shampoo/soap.  After you use shampoo/soap, rinse your hair and body thoroughly to remove shampoo/soap residue.  Turn the water OFF and apply about 3 tablespoons (45 ml) of CHG soap to a CLEAN washcloth.  Apply CHG soap ONLY FROM YOUR NECK DOWN TO YOUR TOES (washing for 3-5 minutes)  DO NOT use CHG soap on face, private areas, open wounds, or sores.  Pay special attention to the area where your  surgery is being performed.  If you are having back surgery, having someone wash your back for you may be helpful. Wait 2 minutes after CHG soap is applied, then you may rinse off the CHG soap.  Pat dry with a clean towel  Put on clean clothes/pajamas   If you choose to wear lotion, please use ONLY the CHG-compatible lotions on the back of this paper.     Additional instructions for the day of surgery:  Shower with regular soap the day of surgery DO NOT APPLY any lotions, deodorants, cologne, or perfumes.   Put on clean/comfortable clothes.  Brush your teeth.  Ask your nurse before applying any prescription medications to the skin.   CHG Compatible Lotions   Aveeno Moisturizing lotion  Cetaphil Moisturizing Cream  Cetaphil Moisturizing Lotion  Clairol Herbal Essence Moisturizing Lotion, Dry Skin  Clairol Herbal Essence Moisturizing Lotion, Extra Dry Skin  Clairol Herbal Essence Moisturizing Lotion, Normal Skin  Curel Age Defying Therapeutic Moisturizing Lotion with Alpha Hydroxy  Curel Extreme Care Body Lotion  Curel Soothing Hands Moisturizing Hand Lotion  Curel Therapeutic Moisturizing Cream, Fragrance-Free  Curel Therapeutic Moisturizing Lotion, Fragrance-Free  Curel Therapeutic Moisturizing Lotion, Original Formula  Eucerin Daily Replenishing Lotion  Eucerin Dry Skin Therapy Plus Alpha Hydroxy Crme  Eucerin Dry Skin Therapy Plus Alpha Hydroxy Lotion  Eucerin Original Crme  Eucerin Original Lotion  Eucerin Plus Crme Eucerin Plus Lotion  Eucerin TriLipid Replenishing Lotion  Keri Anti-Bacterial Hand Lotion  Keri Deep Conditioning Original Lotion Dry Skin Formula Softly Scented  Keri Deep Conditioning Original Lotion, Fragrance Free Sensitive Skin Formula  Keri Lotion Fast Absorbing Fragrance Free Sensitive Skin Formula  Keri Lotion Fast Absorbing Softly Scented Dry Skin Formula  Keri Original Lotion  Keri Skin Renewal Lotion Keri Silky Smooth Lotion  Keri Silky Smooth  Sensitive Skin Lotion  Nivea Body Creamy Conditioning Oil  Nivea Body Extra Enriched Lotion  Nivea Body Original Lotion  Nivea Body Sheer Moisturizing Lotion Nivea Crme  Nivea Skin Firming Lotion  NutraDerm 30 Skin Lotion  NutraDerm Skin Lotion  NutraDerm Therapeutic Skin Cream  NutraDerm Therapeutic Skin Lotion  ProShield Protective Hand Cream  Provon moisturizing lotion   PATIENT SIGNATURE_________________________________  NURSE SIGNATURE__________________________________  ________________________________________________________________________    Diana Fritz  An incentive spirometer is a tool that can help keep your lungs clear and active. This tool measures how well you are filling your lungs with each breath. Taking long deep breaths may help reverse or decrease the chance of developing breathing (pulmonary) problems (especially infection) following: A long period of time when you are unable to move or be active. BEFORE THE PROCEDURE  If the spirometer includes an indicator to show your best effort, your nurse or respiratory therapist will set it to a desired goal. If possible, sit up straight or lean slightly forward. Try not to slouch. Hold the incentive spirometer in an upright position. INSTRUCTIONS FOR USE  Sit on the edge of your bed if possible, or sit up as far as you can in bed or on a chair. Hold the incentive spirometer in an upright position. Breathe out normally. Place the mouthpiece in your mouth and seal your lips tightly around it. Breathe in slowly and as deeply as possible, raising the piston or the ball toward the top of the column. Hold your breath for 3-5 seconds or for as long as possible. Allow the piston or ball to fall to the bottom of the column. Remove the mouthpiece from your mouth and breathe out normally. Rest for a few seconds and repeat Steps 1 through 7 at least 10 times every 1-2 hours when you are awake. Take your time and take a  few normal breaths between deep breaths. The spirometer may include an indicator to show your best effort. Use the indicator as a goal to work toward during each repetition. After each set of 10 deep breaths, practice coughing to be sure your lungs are clear. If you have an incision (the cut made at the time of surgery), support your incision when coughing by placing a pillow or rolled up towels firmly against it. Once you are able to get out of bed, walk around indoors and cough well. You may stop using the incentive spirometer when instructed by your caregiver.  RISKS AND COMPLICATIONS Take your time so you do not get dizzy or light-headed. If you are in pain, you may need to take or ask for pain medication before doing incentive spirometry. It is harder to take a deep breath if you are having pain. AFTER USE Rest and breathe slowly and easily. It can be helpful to keep track of a log of your progress. Your caregiver can provide you with a simple table to help with this. If you are using the spirometer at home, follow these instructions: SEEK MEDICAL CARE IF:  You are having difficultly using the spirometer. You have trouble using the spirometer as often as instructed. Your pain medication is not giving enough relief while using the spirometer. You develop fever of 100.5 F (38.1 C) or higher. SEEK IMMEDIATE MEDICAL CARE IF:  You cough up bloody sputum that had not been present before. You develop fever of 102 F (38.9 C) or greater. You develop worsening pain at or near the incision site. MAKE SURE YOU:  Understand these instructions. Will watch your condition. Will get help right away if you are not doing well or get worse. Document Released: 09/14/2006 Document Revised: 07/27/2011 Document Reviewed: 11/15/2006 Exit[employer] Patient Information 2014 [employer], [place].   ________________________________________________________________________ WHAT IS A BLOOD TRANSFUSION? Blood Transfusion  Information  A transfusion is the replacement of blood or some of its parts. Blood is made up of multiple cells which provide different functions. Red blood cells carry oxygen and are used for blood loss replacement. White blood cells fight against infection. Platelets control bleeding. Plasma helps clot blood. Other blood products are available for specialized needs, such as hemophilia or other clotting disorders. BEFORE THE TRANSFUSION  Who gives blood for transfusions?  Healthy volunteers who are fully evaluated to make sure their blood is safe. This is blood bank blood. Transfusion therapy is the safest it has ever been in the practice of medicine. Before blood is taken from a donor, a complete history is taken to make sure that person has no history of diseases nor engages in risky social behavior (examples are intravenous drug use or sexual activity with  multiple partners). The donor's travel history is screened to minimize risk of transmitting infections, such as malaria. The donated blood is tested for signs of infectious diseases, such as HIV and hepatitis. The blood is then tested to be sure it is compatible with you in order to minimize the chance of a transfusion reaction. If you or a relative donates blood, this is often done in anticipation of surgery and is not appropriate for emergency situations. It takes many days to process the donated blood. RISKS AND COMPLICATIONS Although transfusion therapy is very safe and saves many lives, the main dangers of transfusion include:  Getting an infectious disease. Developing a transfusion reaction. This is an allergic reaction to something in the blood you were given. Every precaution is taken to prevent this. The decision to have a blood transfusion has been considered carefully by your caregiver before blood is given. Blood is not given unless the benefits outweigh the risks. AFTER THE TRANSFUSION Right after receiving a blood transfusion,  you will usually feel much better and more energetic. This is especially true if your red blood cells have gotten low (anemic). The transfusion raises the level of the red blood cells which carry oxygen, and this usually causes an energy increase. The nurse administering the transfusion will monitor you carefully for complications. HOME CARE INSTRUCTIONS  No special instructions are needed after a transfusion. You may find your energy is better. Speak with your caregiver about any limitations on activity for underlying diseases you may have. SEEK MEDICAL CARE IF:  Your condition is not improving after your transfusion. You develop redness or irritation at the intravenous (IV) site. SEEK IMMEDIATE MEDICAL CARE IF:  Any of the following symptoms occur over the next 12 hours: Shaking chills. You have a temperature by mouth above 102 F (38.9 C), not controlled by medicine. Chest, back, or muscle pain. People around you feel you are not acting correctly or are confused. Shortness of breath or difficulty breathing. Dizziness and fainting. You get a rash or develop hives. You have a decrease in urine output. Your urine turns a dark color or changes to pink, red, or brown. Any of the following symptoms occur over the next 10 days: You have a temperature by mouth above 102 F (38.9 C), not controlled by medicine. Shortness of breath. Weakness after normal activity. The white part of the eye turns yellow (jaundice). You have a decrease in the amount of urine or are urinating less often. Your urine turns a dark color or changes to pink, red, or brown. Document Released: 05/01/2000 Document Revised: 07/27/2011 Document Reviewed: 12/19/2007 [facility] Patient Information 2014 [place], [place].  _______________________________________________________________________

## 2024-05-30 ENCOUNTER — Other Ambulatory Visit: Payer: Self-pay

## 2024-05-30 ENCOUNTER — Encounter (HOSPITAL_COMMUNITY)
Admission: RE | Admit: 2024-05-30 | Discharge: 2024-05-30 | Disposition: A | Source: Ambulatory Visit | Attending: Orthopedic Surgery

## 2024-05-30 ENCOUNTER — Encounter (HOSPITAL_COMMUNITY): Payer: Self-pay

## 2024-05-30 VITALS — BP 145/84 | HR 68 | Temp 98.0°F | Resp 16 | Ht 65.0 in | Wt 175.8 lb

## 2024-05-30 DIAGNOSIS — I10 Essential (primary) hypertension: Secondary | ICD-10-CM | POA: Diagnosis not present

## 2024-05-30 DIAGNOSIS — Z01818 Encounter for other preprocedural examination: Secondary | ICD-10-CM | POA: Insufficient documentation

## 2024-05-30 HISTORY — DX: Unspecified osteoarthritis, unspecified site: M19.90

## 2024-05-30 HISTORY — DX: Essential (primary) hypertension: I10

## 2024-05-30 HISTORY — DX: Hypothyroidism, unspecified: E03.9

## 2024-05-30 LAB — BASIC METABOLIC PANEL WITH GFR
Anion gap: 9 (ref 5–15)
BUN: 18 mg/dL (ref 8–23)
CO2: 28 mmol/L (ref 22–32)
Calcium: 10.2 mg/dL (ref 8.9–10.3)
Chloride: 102 mmol/L (ref 98–111)
Creatinine, Ser: 0.69 mg/dL (ref 0.44–1.00)
GFR, Estimated: >60 mL/min
Glucose, Bld: 94 mg/dL (ref 70–99)
Potassium: 4.1 mmol/L (ref 3.5–5.1)
Sodium: 139 mmol/L (ref 135–145)

## 2024-05-30 LAB — CBC
HCT: 38.7 % (ref 36.0–46.0)
Hemoglobin: 12.6 g/dL (ref 12.0–15.0)
MCH: 31.7 pg (ref 26.0–34.0)
MCHC: 32.6 g/dL (ref 30.0–36.0)
MCV: 97.2 fL (ref 80.0–100.0)
Platelets: 363 K/uL (ref 150–400)
RBC: 3.98 MIL/uL (ref 3.87–5.11)
RDW: 15.7 % — ABNORMAL HIGH (ref 11.5–15.5)
WBC: 4.4 K/uL (ref 4.0–10.5)
nRBC: 0 % (ref 0.0–0.2)

## 2024-05-30 NOTE — Progress Notes (Addendum)
 Date of COVID positive in last 90 days:  No  PCP - Tanda Rummer, MD Cardiologist - N/A  Chest x-ray - N/A EKG - October at PCP, copy requested Stress Test - N/A ECHO - N/A Cardiac Cath - N/A Pacemaker/ICD device last checked:N/A Spinal Cord Stimulator:N/A  Bowel Prep - N/A  Sleep Study - N/A CPAP -   Fasting Blood Sugar - N/A Checks Blood Sugar _____ times a day  Last dose of GLP1 agonist-  N/A GLP1 instructions:  Do not take after     Last dose of SGLT-2 inhibitors-  N/A SGLT-2 instructions:  Do not take after    Blood Thinner Instructions: N/A Last dose:   Time: Aspirin Instructions:N/A Last Dose:  Activity level:  Can go up a flight of stairs and perform activities of daily living without stopping and without symptoms of chest pain or shortness of breath.   Anesthesia review: N/A  Patient denies shortness of breath, fever, cough and chest pain at PAT appointment  Patient verbalized understanding of instructions that were given to them at the PAT appointment. Patient was also instructed that they will need to review over the PAT instructions again at home before surgery.

## 2024-05-31 LAB — SURGICAL PCR SCREEN
MRSA, PCR: NEGATIVE
Staphylococcus aureus: POSITIVE — AB

## 2024-05-31 NOTE — Progress Notes (Signed)
 PCR results sent to Dr. Lequita Halt to review.  ?

## 2024-06-06 NOTE — Anesthesia Preprocedure Evaluation (Signed)
"                                    Anesthesia Evaluation  Patient identified by MRN, date of birth, ID band Patient awake    Reviewed: Allergy & Precautions, NPO status , Patient's Chart, lab work & pertinent test results  History of Anesthesia Complications Negative for: history of anesthetic complications  Airway Mallampati: II  TM Distance: >3 FB Neck ROM: Full    Dental  (+) Caps, Dental Advisory Given   Pulmonary neg pulmonary ROS   breath sounds clear to auscultation       Cardiovascular hypertension, Pt. on medications (-) angina  Rhythm:Regular Rate:Normal     Neuro/Psych glaucoma    GI/Hepatic negative GI ROS, Neg liver ROS,,,  Endo/Other  Hypothyroidism  H/o thyroid  cancer BMI 29  Renal/GU negative Renal ROS     Musculoskeletal  (+) Arthritis ,    Abdominal   Peds  Hematology Hb 12.6, plt 363k   Anesthesia Other Findings   Reproductive/Obstetrics                              Anesthesia Physical Anesthesia Plan  ASA: 2  Anesthesia Plan: Spinal   Post-op Pain Management: Tylenol  PO (pre-op)*   Induction:   PONV Risk Score and Plan: 2 and Treatment may vary due to age or medical condition  Airway Management Planned: Natural Airway and Simple Face Mask  Additional Equipment: None  Intra-op Plan:   Post-operative Plan:   Informed Consent: I have reviewed the patients History and Physical, chart, labs and discussed the procedure including the risks, benefits and alternatives for the proposed anesthesia with the patient or authorized representative who has indicated his/her understanding and acceptance.     Dental advisory given  Plan Discussed with: CRNA and Surgeon  Anesthesia Plan Comments:          Anesthesia Quick Evaluation  "

## 2024-06-07 ENCOUNTER — Observation Stay (HOSPITAL_COMMUNITY)
Admission: RE | Admit: 2024-06-07 | Discharge: 2024-06-08 | Disposition: A | Discharge status: Home / Self Care | Source: Ambulatory Visit | Attending: Orthopedic Surgery | Admitting: Orthopedic Surgery

## 2024-06-07 ENCOUNTER — Ambulatory Visit (HOSPITAL_COMMUNITY)

## 2024-06-07 ENCOUNTER — Ambulatory Visit (HOSPITAL_COMMUNITY): Admitting: Anesthesiology

## 2024-06-07 ENCOUNTER — Encounter (HOSPITAL_COMMUNITY): Admitting: Anesthesiology

## 2024-06-07 ENCOUNTER — Encounter (HOSPITAL_COMMUNITY): Payer: Self-pay | Admitting: Orthopedic Surgery

## 2024-06-07 ENCOUNTER — Encounter (HOSPITAL_COMMUNITY): Admission: RE | Payer: Self-pay | Source: Ambulatory Visit

## 2024-06-07 ENCOUNTER — Other Ambulatory Visit: Payer: Self-pay

## 2024-06-07 ENCOUNTER — Observation Stay (HOSPITAL_COMMUNITY)

## 2024-06-07 DIAGNOSIS — I10 Essential (primary) hypertension: Secondary | ICD-10-CM | POA: Insufficient documentation

## 2024-06-07 DIAGNOSIS — M1612 Unilateral primary osteoarthritis, left hip: Principal | ICD-10-CM | POA: Diagnosis present

## 2024-06-07 DIAGNOSIS — Z79899 Other long term (current) drug therapy: Secondary | ICD-10-CM | POA: Diagnosis not present

## 2024-06-07 DIAGNOSIS — M25552 Pain in left hip: Secondary | ICD-10-CM | POA: Diagnosis present

## 2024-06-07 DIAGNOSIS — E039 Hypothyroidism, unspecified: Secondary | ICD-10-CM | POA: Insufficient documentation

## 2024-06-07 DIAGNOSIS — M169 Osteoarthritis of hip, unspecified: Principal | ICD-10-CM | POA: Diagnosis present

## 2024-06-07 HISTORY — PX: TOTAL HIP ARTHROPLASTY: SHX124

## 2024-06-07 LAB — ABO/RH: ABO/RH(D): O POS

## 2024-06-07 LAB — TYPE AND SCREEN
ABO/RH(D): O POS
Antibody Screen: NEGATIVE

## 2024-06-07 MED ORDER — PROPOFOL 10 MG/ML IV BOLUS
INTRAVENOUS | Status: AC
  Filled 2024-06-07: qty 20

## 2024-06-07 MED ORDER — POVIDONE-IODINE 10 % EX SWAB
2.0000 | Freq: Once | CUTANEOUS | Status: AC
  Administered 2024-06-07: 2 via TOPICAL

## 2024-06-07 MED ORDER — ACETAMINOPHEN 500 MG PO TABS: 500.0000 mg | ORAL_TABLET | Freq: Four times a day (QID) | ORAL | Status: DC

## 2024-06-07 MED ORDER — METOCLOPRAMIDE HCL 5 MG PO TABS: 5.0000 mg | ORAL_TABLET | Freq: Three times a day (TID) | ORAL | Status: DC | PRN

## 2024-06-07 MED ORDER — HYDROMORPHONE HCL 1 MG/ML IJ SOLN
INTRAMUSCULAR | Status: AC
  Filled 2024-06-07: qty 1

## 2024-06-07 MED ORDER — ORAL CARE MOUTH RINSE: 15.0000 mL | Freq: Once | OROMUCOSAL | Status: AC

## 2024-06-07 MED ORDER — LEVOTHYROXINE SODIUM 88 MCG PO TABS
88.0000 ug | ORAL_TABLET | Freq: Every day | ORAL | Status: DC
  Administered 2024-06-08: 88 ug via ORAL
  Filled 2024-06-07: qty 1

## 2024-06-07 MED ORDER — METHOCARBAMOL 500 MG PO TABS
500.0000 mg | ORAL_TABLET | Freq: Four times a day (QID) | ORAL | Status: DC | PRN
  Administered 2024-06-07 – 2024-06-08 (×3): 500 mg via ORAL
  Filled 2024-06-07 (×3): qty 1

## 2024-06-07 MED ORDER — OXYCODONE HCL 5 MG PO TABS
5.0000 mg | ORAL_TABLET | Freq: Once | ORAL | Status: AC | PRN
  Administered 2024-06-07: 5 mg via ORAL

## 2024-06-07 MED ORDER — MAGNESIUM CITRATE PO SOLN: 1.0000 | Freq: Once | ORAL | Status: DC | PRN

## 2024-06-07 MED ORDER — MEPIVACAINE HCL (PF) 2 % IJ SOLN
INTRAMUSCULAR | Status: DC | PRN
  Administered 2024-06-07: 60 mg via INTRATHECAL

## 2024-06-07 MED ORDER — BUPIVACAINE-EPINEPHRINE (PF) 0.25% -1:200000 IJ SOLN
INTRAMUSCULAR | Status: DC | PRN
  Administered 2024-06-07: 30 mL

## 2024-06-07 MED ORDER — CEFAZOLIN SODIUM-DEXTROSE 2-4 GM/100ML-% IV SOLN
2.0000 g | INTRAVENOUS | Status: AC
  Administered 2024-06-07: 2 g via INTRAVENOUS
  Filled 2024-06-07: qty 100

## 2024-06-07 MED ORDER — METOCLOPRAMIDE HCL 5 MG/ML IJ SOLN
5.0000 mg | Freq: Three times a day (TID) | INTRAMUSCULAR | Status: DC | PRN
  Administered 2024-06-08: 10 mg via INTRAVENOUS
  Filled 2024-06-07: qty 2

## 2024-06-07 MED ORDER — HYDROCODONE-ACETAMINOPHEN 5-325 MG PO TABS: 1.0000 | ORAL_TABLET | ORAL | Status: DC | PRN

## 2024-06-07 MED ORDER — CEFAZOLIN SODIUM-DEXTROSE 2-4 GM/100ML-% IV SOLN
2.0000 g | Freq: Four times a day (QID) | INTRAVENOUS | Status: AC
  Administered 2024-06-07 (×2): 2 g via INTRAVENOUS
  Filled 2024-06-07 (×2): qty 100

## 2024-06-07 MED ORDER — ONDANSETRON HCL 4 MG/2ML IJ SOLN
INTRAMUSCULAR | Status: DC | PRN
  Administered 2024-06-07: 4 mg via INTRAVENOUS

## 2024-06-07 MED ORDER — MEPERIDINE HCL 25 MG/ML IJ SOLN: 6.2500 mg | INTRAMUSCULAR | Status: DC | PRN

## 2024-06-07 MED ORDER — MENTHOL 3 MG MT LOZG: 1.0000 | LOZENGE | OROMUCOSAL | Status: DC | PRN

## 2024-06-07 MED ORDER — MIDAZOLAM HCL (PF) 2 MG/2ML IJ SOLN: 0.5000 mg | Freq: Once | INTRAMUSCULAR | Status: DC | PRN

## 2024-06-07 MED ORDER — DORZOLAMIDE HCL-TIMOLOL MAL 2-0.5 % OP SOLN
1.0000 [drp] | Freq: Two times a day (BID) | OPHTHALMIC | Status: DC
  Administered 2024-06-07 – 2024-06-08 (×2): 1 [drp] via OPHTHALMIC
  Filled 2024-06-07: qty 10

## 2024-06-07 MED ORDER — PROPOFOL 10 MG/ML IV BOLUS
INTRAVENOUS | Status: DC | PRN
  Administered 2024-06-07 (×2): 20 mg via INTRAVENOUS

## 2024-06-07 MED ORDER — HYDROCODONE-ACETAMINOPHEN 7.5-325 MG PO TABS
1.0000 | ORAL_TABLET | ORAL | Status: DC | PRN
  Administered 2024-06-07 – 2024-06-08 (×3): 1 via ORAL
  Administered 2024-06-08: 2 via ORAL
  Administered 2024-06-08: 1 via ORAL
  Filled 2024-06-07: qty 2
  Filled 2024-06-07: qty 1
  Filled 2024-06-07: qty 2
  Filled 2024-06-07: qty 1
  Filled 2024-06-07: qty 2

## 2024-06-07 MED ORDER — SODIUM CHLORIDE 0.9 % IV SOLN: INTRAVENOUS | Status: DC

## 2024-06-07 MED ORDER — MORPHINE SULFATE (PF) 2 MG/ML IV SOLN
0.5000 mg | INTRAVENOUS | Status: DC | PRN
  Administered 2024-06-07 – 2024-06-08 (×2): 1 mg via INTRAVENOUS
  Filled 2024-06-07 (×2): qty 1

## 2024-06-07 MED ORDER — HYDROCHLOROTHIAZIDE 12.5 MG PO TABS
6.2500 mg | ORAL_TABLET | Freq: Every day | ORAL | Status: DC
  Administered 2024-06-08: 6.25 mg via ORAL
  Filled 2024-06-07 (×2): qty 1

## 2024-06-07 MED ORDER — TRANEXAMIC ACID-NACL 1000-0.7 MG/100ML-% IV SOLN
1000.0000 mg | INTRAVENOUS | Status: AC
  Administered 2024-06-07: 1000 mg via INTRAVENOUS
  Filled 2024-06-07: qty 100

## 2024-06-07 MED ORDER — LIDOCAINE HCL (PF) 2 % IJ SOLN
INTRAMUSCULAR | Status: AC
  Filled 2024-06-07: qty 5

## 2024-06-07 MED ORDER — FENTANYL CITRATE (PF) 100 MCG/2ML IJ SOLN
INTRAMUSCULAR | Status: DC | PRN
  Administered 2024-06-07 (×2): 50 ug via INTRAVENOUS

## 2024-06-07 MED ORDER — WATER FOR IRRIGATION, STERILE IR SOLN
Status: DC | PRN
  Administered 2024-06-07: 1000 mL

## 2024-06-07 MED ORDER — ONDANSETRON HCL 4 MG/2ML IJ SOLN
INTRAMUSCULAR | Status: AC
  Filled 2024-06-07: qty 2

## 2024-06-07 MED ORDER — SIMVASTATIN 20 MG PO TABS: 20.0000 mg | ORAL_TABLET | Freq: Every day | ORAL | Status: DC

## 2024-06-07 MED ORDER — MUPIROCIN 2 % EX OINT: 1.0000 | TOPICAL_OINTMENT | Freq: Two times a day (BID) | CUTANEOUS | 0 refills | Status: AC

## 2024-06-07 MED ORDER — ACETAMINOPHEN 500 MG PO TABS
500.0000 mg | ORAL_TABLET | Freq: Four times a day (QID) | ORAL | Status: AC
  Administered 2024-06-07 – 2024-06-08 (×3): 500 mg via ORAL
  Filled 2024-06-07 (×4): qty 1

## 2024-06-07 MED ORDER — DEXAMETHASONE SOD PHOSPHATE PF 10 MG/ML IJ SOLN
10.0000 mg | Freq: Once | INTRAMUSCULAR | Status: AC
  Administered 2024-06-08: 10 mg via INTRAVENOUS
  Filled 2024-06-07: qty 1

## 2024-06-07 MED ORDER — PROPOFOL 500 MG/50ML IV EMUL
INTRAVENOUS | Status: DC | PRN
  Administered 2024-06-07: 100 ug/kg/min via INTRAVENOUS

## 2024-06-07 MED ORDER — CHLORHEXIDINE GLUCONATE 0.12 % MT SOLN
15.0000 mL | Freq: Once | OROMUCOSAL | Status: AC
  Administered 2024-06-07: 15 mL via OROMUCOSAL

## 2024-06-07 MED ORDER — DEXAMETHASONE SOD PHOSPHATE PF 10 MG/ML IJ SOLN
INTRAMUSCULAR | Status: AC
  Filled 2024-06-07: qty 1

## 2024-06-07 MED ORDER — ACETAMINOPHEN 500 MG PO TABS: 1000.0000 mg | ORAL_TABLET | Freq: Once | ORAL | Status: DC

## 2024-06-07 MED ORDER — DOCUSATE SODIUM 100 MG PO CAPS
100.0000 mg | ORAL_CAPSULE | Freq: Two times a day (BID) | ORAL | Status: DC
  Administered 2024-06-07 – 2024-06-08 (×2): 100 mg via ORAL
  Filled 2024-06-07 (×3): qty 1

## 2024-06-07 MED ORDER — METHOCARBAMOL 1000 MG/10ML IJ SOLN: 500.0000 mg | Freq: Four times a day (QID) | INTRAMUSCULAR | Status: DC | PRN

## 2024-06-07 MED ORDER — PHENOL 1.4 % MT LIQD: 1.0000 | OROMUCOSAL | Status: DC | PRN

## 2024-06-07 MED ORDER — BUPIVACAINE-EPINEPHRINE (PF) 0.25% -1:200000 IJ SOLN
INTRAMUSCULAR | Status: AC
  Filled 2024-06-07: qty 30

## 2024-06-07 MED ORDER — ONDANSETRON HCL 4 MG PO TABS: 4.0000 mg | ORAL_TABLET | Freq: Four times a day (QID) | ORAL | Status: DC | PRN

## 2024-06-07 MED ORDER — OXYCODONE HCL 5 MG PO TABS
ORAL_TABLET | ORAL | Status: AC
  Filled 2024-06-07: qty 1

## 2024-06-07 MED ORDER — LACTATED RINGERS IV SOLN: INTRAVENOUS | Status: DC

## 2024-06-07 MED ORDER — ONDANSETRON HCL 4 MG/2ML IJ SOLN: 4.0000 mg | Freq: Four times a day (QID) | INTRAMUSCULAR | Status: DC | PRN

## 2024-06-07 MED ORDER — LIDOCAINE HCL (CARDIAC) PF 100 MG/5ML IV SOSY
PREFILLED_SYRINGE | INTRAVENOUS | Status: DC | PRN
  Administered 2024-06-07: 40 mg via INTRAVENOUS
  Administered 2024-06-07: 60 mg via INTRAVENOUS

## 2024-06-07 MED ORDER — ACETAMINOPHEN 325 MG PO TABS: 325.0000 mg | ORAL_TABLET | Freq: Four times a day (QID) | ORAL | Status: DC | PRN

## 2024-06-07 MED ORDER — FENTANYL CITRATE (PF) 100 MCG/2ML IJ SOLN
INTRAMUSCULAR | Status: AC
  Filled 2024-06-07: qty 2

## 2024-06-07 MED ORDER — 0.9 % SODIUM CHLORIDE (POUR BTL) OPTIME
TOPICAL | Status: DC | PRN
  Administered 2024-06-07: 1000 mL

## 2024-06-07 MED ORDER — HYDROMORPHONE HCL 1 MG/ML IJ SOLN
0.2500 mg | INTRAMUSCULAR | Status: DC | PRN
  Administered 2024-06-07 (×4): 0.5 mg via INTRAVENOUS

## 2024-06-07 MED ORDER — OXYCODONE HCL 5 MG/5ML PO SOLN: 5.0000 mg | Freq: Once | ORAL | Status: AC | PRN

## 2024-06-07 MED ORDER — BISACODYL 10 MG RE SUPP: 10.0000 mg | Freq: Every day | RECTAL | Status: DC | PRN

## 2024-06-07 MED ORDER — CHLORHEXIDINE GLUCONATE 4 % EX SOLN: 1.0000 | CUTANEOUS | 1 refills | Status: AC

## 2024-06-07 MED ORDER — PHENYLEPHRINE HCL-NACL 20-0.9 MG/250ML-% IV SOLN
INTRAVENOUS | Status: DC | PRN
  Administered 2024-06-07: 20 ug/min via INTRAVENOUS

## 2024-06-07 MED ORDER — EPHEDRINE SULFATE (PRESSORS) 25 MG/5ML IV SOSY
PREFILLED_SYRINGE | INTRAVENOUS | Status: DC | PRN
  Administered 2024-06-07: 2.5 mg via INTRAVENOUS
  Administered 2024-06-07 (×2): 5 mg via INTRAVENOUS

## 2024-06-07 MED ORDER — PROPOFOL 1000 MG/100ML IV EMUL
INTRAVENOUS | Status: AC
  Filled 2024-06-07: qty 100

## 2024-06-07 MED ORDER — POLYETHYLENE GLYCOL 3350 17 G PO PACK: 17.0000 g | PACK | Freq: Every day | ORAL | Status: DC | PRN

## 2024-06-07 MED ORDER — ACETAMINOPHEN 10 MG/ML IV SOLN
1000.0000 mg | Freq: Four times a day (QID) | INTRAVENOUS | Status: DC
  Administered 2024-06-07: 1000 mg via INTRAVENOUS
  Filled 2024-06-07: qty 100

## 2024-06-07 MED ORDER — DEXAMETHASONE SOD PHOSPHATE PF 10 MG/ML IJ SOLN
8.0000 mg | Freq: Once | INTRAMUSCULAR | Status: AC
  Administered 2024-06-07: 8 mg via INTRAVENOUS

## 2024-06-07 MED ORDER — ASPIRIN 81 MG PO CHEW
81.0000 mg | CHEWABLE_TABLET | Freq: Two times a day (BID) | ORAL | Status: DC
  Administered 2024-06-08: 81 mg via ORAL
  Filled 2024-06-07: qty 1

## 2024-06-07 NOTE — Discharge Instructions (Addendum)
Frank Aluisio, MD Total Joint Specialist [facility] [address] [place], [place] [address] [phone]  ANTERIOR APPROACH TOTAL HIP REPLACEMENT POSTOPERATIVE DIRECTIONS     Hip Rehabilitation, Guidelines Following Surgery  The results of a hip operation are greatly improved after range of motion and muscle strengthening exercises. Follow all safety measures which are given to protect your hip. If any of these exercises cause increased pain or swelling in your joint, decrease the amount until you are comfortable again. Then slowly increase the exercises. Call your caregiver if you have problems or questions.   BLOOD CLOT PREVENTION Take an 81 mg Aspirin two times a day for three weeks following surgery. Then take an 81 mg Aspirin once a day for three weeks. Then discontinue Aspirin. You may resume your vitamins/supplements upon discharge from the hospital. Do not take any NSAIDs (Advil, Aleve, Ibuprofen, Meloxicam, etc.) until you are 3 weeks out from surgery  HOME CARE INSTRUCTIONS  Remove items at home which could result in a fall. This includes throw rugs or furniture in walking pathways.  ICE to the affected hip as frequently as 20-30 minutes an hour and then as needed for pain and swelling. Continue to use ice on the hip for pain and swelling from surgery. You may notice swelling that will progress down to the foot and ankle. This is normal after surgery. Elevate the leg when you are not up walking on it.   Continue to use the breathing machine which will help keep your temperature down.  It is common for your temperature to cycle up and down following surgery, especially at night when you are not up moving around and exerting yourself.  The breathing machine keeps your lungs expanded and your temperature down.  DIET You may resume your previous home diet once your are discharged from the hospital.  DRESSING / WOUND CARE / SHOWERING You have an  adhesive waterproof bandage over the incision. Leave this in place until your first follow-up appointment. Once you remove this you will not need to place another bandage.  You may begin showering 3 days following surgery, but do not submerge the incision under water.  ACTIVITY For the first 3-5 days, it is important to rest and keep the operative leg elevated. You should, as a general rule, rest for 50 minutes and walk/stretch for 10 minutes per hour. After 5 days, you may slowly increase activity as tolerated.  Perform the exercises you were provided twice a day for about 15-20 minutes each session. Begin these 2 days following surgery. Walk with your walker as instructed. Use the walker until you are comfortable transitioning to a cane. Walk with the cane in the opposite hand of the operative leg. You may discontinue the cane once you are comfortable and walking steadily. Avoid periods of inactivity such as sitting longer than an hour when not asleep. This helps prevent blood clots.  Do not drive a car for 6 weeks or until released by your surgeon.  Do not drive while taking narcotics.  TED HOSE STOCKINGS Wear the elastic stockings on both legs for three weeks following surgery during the day. You may remove them at night while sleeping.  WEIGHT BEARING Weight bearing as tolerated with assist device (walker, cane, etc) as directed, use it as long as suggested by your surgeon or therapist, typically at least 4-6 weeks.  POSTOPERATIVE CONSTIPATION PROTOCOL Constipation - defined medically as fewer than three stools per week and severe constipation as   less than one stool per week.  One of the most common issues patients have following surgery is constipation.  Even if you have a regular bowel pattern at home, your normal regimen is likely to be disrupted due to multiple reasons following surgery.  Combination of anesthesia, postoperative narcotics, change in appetite and fluid intake all can  affect your bowels.  In order to avoid complications following surgery, here are some recommendations in order to help you during your recovery period.  Colace (docusate) - Pick up an over-the-counter form of Colace or another stool softener and take twice a day as long as you are requiring postoperative pain medications.  Take with a full glass of water daily.  If you experience loose stools or diarrhea, hold the colace until you stool forms back up.  If your symptoms do not get better within 1 week or if they get worse, check with your doctor. Dulcolax (bisacodyl) - Pick up over-the-counter and take as directed by the product packaging as needed to assist with the movement of your bowels.  Take with a full glass of water.  Use this product as needed if not relieved by Colace only.  MiraLax (polyethylene glycol) - Pick up over-the-counter to have on hand.  MiraLax is a solution that will increase the amount of water in your bowels to assist with bowel movements.  Take as directed and can mix with a glass of water, juice, soda, coffee, or tea.  Take if you go more than two days without a movement.Do not use MiraLax more than once per day. Call your doctor if you are still constipated or irregular after using this medication for 7 days in a row.  If you continue to have problems with postoperative constipation, please contact the office for further assistance and recommendations.  If you experience "the worst abdominal pain ever" or develop nausea or vomiting, please contact the office immediatly for further recommendations for treatment.  ITCHING  If you experience itching with your medications, try taking only a single pain pill, or even half a pain pill at a time.  You can also use Benadryl over the counter for itching or also to help with sleep.   MEDICATIONS See your medication summary on the "After Visit Summary" that the nursing staff will review with you prior to discharge.  You may have some home  medications which will be placed on hold until you complete the course of blood thinner medication.  It is important for you to complete the blood thinner medication as prescribed by your surgeon.  Continue your approved medications as instructed at time of discharge.  PRECAUTIONS If you experience chest pain or shortness of breath - call 911 immediately for transfer to the hospital emergency department.  If you develop a fever greater that 101 F, purulent drainage from wound, increased redness or drainage from wound, foul odor from the wound/dressing, or calf pain - CONTACT YOUR SURGEON.                                                   FOLLOW-UP APPOINTMENTS Make sure you keep all of your appointments after your operation with your surgeon and caregivers. You should call the office at the above phone number and make an appointment for approximately two weeks after the date of your surgery or on the   date instructed by your surgeon outlined in the "After Visit Summary".  RANGE OF MOTION AND STRENGTHENING EXERCISES  These exercises are designed to help you keep full movement of your hip joint. Follow your caregiver's or physical therapist's instructions. Perform all exercises about fifteen times, three times per day or as directed. Exercise both hips, even if you have had only one joint replacement. These exercises can be done on a training (exercise) mat, on the floor, on a table or on a bed. Use whatever works the best and is most comfortable for you. Use music or television while you are exercising so that the exercises are a pleasant break in your day. This will make your life better with the exercises acting as a break in routine you can look forward to.  Lying on your back, slowly slide your foot toward your buttocks, raising your knee up off the floor. Then slowly slide your foot back down until your leg is straight again.  Lying on your back spread your legs as far apart as you can without causing  discomfort.  Lying on your side, raise your upper leg and foot straight up from the floor as far as is comfortable. Slowly lower the leg and repeat.  Lying on your back, tighten up the muscle in the front of your thigh (quadriceps muscles). You can do this by keeping your leg straight and trying to raise your heel off the floor. This helps strengthen the largest muscle supporting your knee.  Lying on your back, tighten up the muscles of your buttocks both with the legs straight and with the knee bent at a comfortable angle while keeping your heel on the floor.   POST-OPERATIVE OPIOID TAPER INSTRUCTIONS: It is important to wean off of your opioid medication as soon as possible. If you do not need pain medication after your surgery it is ok to stop day one. Opioids include: Codeine, Hydrocodone(Norco, Vicodin), Oxycodone(Percocet, oxycontin) and hydromorphone amongst others.  Long term and even short term use of opiods can cause: Increased pain response Dependence Constipation Depression Respiratory depression And more.  Withdrawal symptoms can include Flu like symptoms Nausea, vomiting And more Techniques to manage these symptoms Hydrate well Eat regular healthy meals Stay active Use relaxation techniques(deep breathing, meditating, yoga) Do Not substitute Alcohol to help with tapering If you have been on opioids for less than two weeks and do not have pain than it is ok to stop all together.  Plan to wean off of opioids This plan should start within one week post op of your joint replacement. Maintain the same interval or time between taking each dose and first decrease the dose.  Cut the total daily intake of opioids by one tablet each day Next start to increase the time between doses. The last dose that should be eliminated is the evening dose.   IF YOU ARE TRANSFERRED TO A SKILLED REHAB FACILITY If the patient is transferred to a skilled rehab facility following release from the  hospital, a list of the current medications will be sent to the facility for the patient to continue.  When discharged from the skilled rehab facility, please have the facility set up the patient's Home Health Physical Therapy prior to being released. Also, the skilled facility will be responsible for providing the patient with their medications at time of release from the facility to include their pain medication, the muscle relaxants, and their blood thinner medication. If the patient is still at the rehab facility   at time of the two week follow up appointment, the skilled rehab facility will also need to assist the patient in arranging follow up appointment in our office and any transportation needs.  MAKE SURE YOU:  Understand these instructions.  Get help right away if you are not doing well or get worse.    DENTAL ANTIBIOTICS:  In most cases prophylactic antibiotics for Dental procdeures after total joint surgery are not necessary.  Exceptions are as follows:  1. History of prior total joint infection  2. Severely immunocompromised (Organ Transplant, cancer chemotherapy, Rheumatoid biologic meds such as Humera)  3. Poorly controlled diabetes (A1C &gt; 8.0, blood glucose over 200)  If you have one of these conditions, contact your surgeon for an antibiotic prescription, prior to your dental procedure.    Pick up stool softner and laxative for home use following surgery while on pain medications. Do not submerge incision under water. Please use good hand washing techniques while changing dressing each day. May shower starting three days after surgery. Please use a clean towel to pat the incision dry following showers. Continue to use ice for pain and swelling after surgery. Do not use any lotions or creams on the incision until instructed by your surgeon.  

## 2024-06-07 NOTE — Anesthesia Procedure Notes (Signed)
 Procedure Name: MAC Date/Time: 06/07/2024 9:35 AM  Performed by: Metta Andrea NOVAK, CRNAPre-anesthesia Checklist: Patient identified, Emergency Drugs available, Suction available, Patient being monitored and Timeout performed Oxygen Delivery Method: Simple face mask Placement Confirmation: positive ETCO2

## 2024-06-07 NOTE — Anesthesia Procedure Notes (Signed)
 Spinal  Patient location during procedure: OR End time: 06/07/2024 9:44 AM Reason for block: surgical anesthesia  Staffing Performed: anesthesiologist  Authorized by: Leonce Athens, MD   Performed by: Leonce Athens, MD  Preanesthetic Checklist Completed: patient identified, IV checked, site marked, risks and benefits discussed, surgical consent, monitors and equipment checked, pre-op evaluation and timeout performed Spinal Block Patient position: sitting Prep: DuraPrep Patient monitoring: heart rate, cardiac monitor, continuous pulse ox and blood pressure Approach: midline Location: L3-4 Injection technique: single-shot Needle Needle type: Pencan and Introducer  Needle gauge: 24 G Needle length: 9 cm Assessment Sensory level: T4 Events: CSF return and second provider  Additional Notes Pt identified in Operating room.  Monitors applied. Working IV access confirmed. Timeout, Sterile prep, drape lumbar spine.  1% lido local L 3,4.  #24ga Pencan CRNA all attempts os, MD into clear CSF L 3,4.  60 mg 2% Mepivacaine  injected with asp CSF beginning and end of injection.  Patient asymptomatic, VSS, no heme aspirated, tolerated well.  JAYSON Leonce, MD

## 2024-06-07 NOTE — Transfer of Care (Signed)
 Immediate Anesthesia Transfer of Care Note  Patient: Diana Fritz  Procedure(s) Performed: ARTHROPLASTY, HIP, TOTAL, ANTERIOR APPROACH (Left: Hip)  Patient Location: PACU  Anesthesia Type:Spinal  Level of Consciousness: awake, alert , and drowsy  Airway & Oxygen Therapy: Patient Spontanous Breathing and Patient connected to nasal cannula oxygen  Post-op Assessment: Report given to RN and Post -op Vital signs reviewed and stable  Post vital signs: Reviewed and stable  Last Vitals:  Vitals Value Taken Time  BP 100/64 06/07/24 11:20  Temp    Pulse 72 06/07/24 11:23  Resp 15 06/07/24 11:23  SpO2 96 % 06/07/24 11:23  Vitals shown include unfiled device data.  Last Pain:  Vitals:   06/07/24 0805  PainSc: 0-No pain         Complications: No notable events documented.

## 2024-06-07 NOTE — Anesthesia Postprocedure Evaluation (Signed)
"   Anesthesia Post Note  Patient: Diana Fritz  Procedure(s) Performed: ARTHROPLASTY, HIP, TOTAL, ANTERIOR APPROACH (Left: Hip)     Patient location during evaluation: PACU Anesthesia Type: Spinal Level of consciousness: awake and alert, oriented and patient cooperative Pain management: pain level controlled Vital Signs Assessment: post-procedure vital signs reviewed and stable Respiratory status: spontaneous breathing, nonlabored ventilation and respiratory function stable Cardiovascular status: blood pressure returned to baseline and stable Postop Assessment: no apparent nausea or vomiting, spinal receding and patient able to bend at knees Anesthetic complications: no   No notable events documented.  Last Vitals:  Vitals:   06/07/24 1300 06/07/24 1329  BP: (!) 127/95 (!) 154/95  Pulse: 61 (!) 55  Resp: 14 16  Temp:  36.4 C  SpO2: 100% 98%    Last Pain:  Vitals:   06/07/24 1329  TempSrc: Oral  PainSc:                  Dameion Briles,E. Willford Rabideau      "

## 2024-06-07 NOTE — Evaluation (Signed)
 Physical Therapy Evaluation Patient Details Name: Diana Fritz MRN: [id number] DOB: 1945-05-26 Today's Date: 06/07/2024  History of Present Illness  Pt is 79 yo female admitted on 06/07/24 for L THA.  Pt with hx including but not limited to anemia, glaucoma, sciatica, and arthritis and R THA.  Clinical Impression  Pt is s/p THA resulting in the deficits listed below (see PT Problem List). At baseline, pt can ambulate in community with a cane.  She lives alone but daughter will be staying to assist for several days.  Pt has DME from prior R THA.   Today, pt able to transfer with min A and ambulated 20' with RW and CGA.  Pt reports some wooziness that limited gait distance; all VSS.  She did have 8/10 pain prior to therapy and reports improved in chair. She is expected to progress well with therapy. Pt will benefit from acute skilled PT to increase their independence and safety with mobility to facilitate discharge.          If plan is discharge home, recommend the following: A little help with walking and/or transfers;A little help with bathing/dressing/bathroom;Assistance with cooking/housework;Help with stairs or ramp for entrance   Can travel by private vehicle        Equipment Recommendations None recommended by PT  Recommendations for Other Services       Functional Status Assessment Patient has had a recent decline in their functional status and demonstrates the ability to make significant improvements in function in a reasonable and predictable amount of time.     Precautions / Restrictions Precautions Precautions: Fall Restrictions LLE Weight Bearing Per Provider Order: Weight bearing as tolerated      Mobility  Bed Mobility Overal bed mobility: Needs Assistance Bed Mobility: Supine to Sit     Supine to sit: Min assist, HOB elevated          Transfers Overall transfer level: Needs assistance Equipment used: Rolling walker (2 wheels) Transfers: Sit to/from  Stand Sit to Stand: Min assist           General transfer comment: light min A and cues for hand placement    Ambulation/Gait Ambulation/Gait assistance: Contact guard assist Gait Distance (Feet): 20 Feet Assistive device: Rolling walker (2 wheels) Gait Pattern/deviations: Step-to pattern, Decreased stride length, Trunk flexed Gait velocity: decreased     General Gait Details: cues for RW use; reports felt woozy so distance limited - vital signs were stable (BP elevated 140's/70's)  Stairs            Wheelchair Mobility     Tilt Bed    Modified Rankin (Stroke Patients Only)       Balance Overall balance assessment: Needs assistance Sitting-balance support: No upper extremity supported Sitting balance-Leahy Scale: Good     Standing balance support: Bilateral upper extremity supported Standing balance-Leahy Scale: Poor Standing balance comment: steady with RW                             Pertinent Vitals/Pain Pain Assessment Pain Assessment: 0-10 Pain Score: 8  Pain Location: L hip Pain Descriptors / Indicators: Discomfort Pain Intervention(s): Limited activity within patient's tolerance, Monitored during session, Repositioned, Premedicated before session    Home Living Family/patient expects to be discharged to:: Private residence Living Arrangements: Alone Available Help at Discharge: Family;Available 24 hours/day (Daughter staying for several days) Type of Home: House Home Access: Ramped entrance  Home Layout: Multi-level;Able to live on main level with bedroom/bathroom Home Equipment: Shower seat;Rolling Walker (2 wheels);Cane - single point      Prior Function Prior Level of Function : Independent/Modified Independent             Mobility Comments: Recently using cane for ambulation due to pain; could do community distances ADLs Comments: Pt independent with ADLs other than socks and shoes; does light iadls      Extremity/Trunk Assessment   Upper Extremity Assessment Upper Extremity Assessment: Overall WFL for tasks assessed    Lower Extremity Assessment Lower Extremity Assessment: LLE deficits/detail;RLE deficits/detail RLE Deficits / Details: ROM WFL; MMT 5/5 LLE Deficits / Details: Expected post op changes; ROM WFL; MMT ankle 5/5, knee ext 3/5, hip 1/5    Cervical / Trunk Assessment Cervical / Trunk Assessment: Normal  Communication        Cognition Arousal: Alert Behavior During Therapy: WFL for tasks assessed/performed   PT - Cognitive impairments: No apparent impairments                                 Cueing       General Comments      Exercises     Assessment/Plan    PT Assessment Patient needs continued PT services  PT Problem List Decreased strength;Decreased range of motion;Decreased balance;Decreased activity tolerance;Decreased mobility;Decreased knowledge of use of DME;Pain       PT Treatment Interventions DME instruction;Therapeutic exercise;Gait training;Stair training;Functional mobility training;Therapeutic activities;Patient/family education;Balance training;Modalities    PT Goals (Current goals can be found in the Care Plan section)  Acute Rehab PT Goals Patient Stated Goal: return home PT Goal Formulation: With patient/family Time For Goal Achievement: 06/21/24 Potential to Achieve Goals: Good    Frequency 7X/week     Co-evaluation               AM-PAC PT 6 Clicks Mobility  Outcome Measure Help needed turning from your back to your side while in a flat bed without using bedrails?: A Little Help needed moving from lying on your back to sitting on the side of a flat bed without using bedrails?: A Little Help needed moving to and from a bed to a chair (including a wheelchair)?: A Little Help needed standing up from a chair using your arms (e.g., wheelchair or bedside chair)?: A Little Help needed to walk in hospital  room?: A Little Help needed climbing 3-5 steps with a railing? : A Lot 6 Click Score: 17    End of Session Equipment Utilized During Treatment: Gait belt Activity Tolerance: Patient tolerated treatment well Patient left: with chair alarm set;in chair;with call bell/phone within reach;with family/visitor present;with SCD's reapplied Nurse Communication: Mobility status PT Visit Diagnosis: Other abnormalities of gait and mobility (R26.89);Muscle weakness (generalized) (M62.81)    Time: 1459-1530 PT Time Calculation (min) (ACUTE ONLY): 31 min   Charges:   PT Evaluation $PT Eval Low Complexity: 1 Low PT Treatments $Gait Training: 8-22 mins PT General Charges $$ ACUTE PT VISIT: 1 Visit         Benjiman, PT Acute Rehab [facility] [phone]   Benjiman VEAR Mulberry 06/07/2024, 3:52 PM

## 2024-06-07 NOTE — Plan of Care (Signed)
" °  Problem: Education: Goal: Knowledge of General Education information will improve Description: Including pain rating scale, medication(s)/side effects and non-pharmacologic comfort measures Outcome: Progressing   Problem: Health Behavior/Discharge Planning: Goal: Ability to manage health-related needs will improve Outcome: Progressing   Problem: Clinical Measurements: Goal: Ability to maintain clinical measurements within normal limits will improve Outcome: Progressing Goal: Will remain free from infection Outcome: Progressing Goal: Diagnostic test results will improve Outcome: Progressing Goal: Respiratory complications will improve Outcome: Progressing Goal: Cardiovascular complication will be avoided Outcome: Progressing   Problem: Activity: Goal: Risk for activity intolerance will decrease Outcome: Progressing   Problem: Nutrition: Goal: Adequate nutrition will be maintained Outcome: Progressing   Problem: Coping: Goal: Level of anxiety will decrease Outcome: Progressing   Problem: Elimination: Goal: Will not experience complications related to bowel motility Outcome: Progressing Goal: Will not experience complications related to urinary retention Outcome: Progressing   Problem: Pain Managment: Goal: General experience of comfort will improve and/or be controlled Outcome: Progressing   Problem: Safety: Goal: Ability to remain free from injury will improve Outcome: Progressing   Problem: Skin Integrity: Goal: Risk for impaired skin integrity will decrease Outcome: Progressing   Problem: Education: Goal: Knowledge of the prescribed therapeutic regimen will improve Outcome: Progressing   Problem: Bowel/Gastric: Goal: Gastrointestinal status for postoperative course will improve Outcome: Progressing   Problem: Cardiac: Goal: Ability to maintain an adequate cardiac output Outcome: Progressing Goal: Will show no evidence of cardiac arrhythmias Outcome:  Progressing   Problem: Nutritional: Goal: Will attain and maintain optimal nutritional status Outcome: Progressing   Problem: Neurological: Goal: Will regain or maintain usual level of consciousness Outcome: Progressing   Problem: Clinical Measurements: Goal: Ability to maintain clinical measurements within normal limits Outcome: Progressing Goal: Postoperative complications will be avoided or minimized Outcome: Progressing   Problem: Respiratory: Goal: Will regain and/or maintain adequate ventilation Outcome: Progressing Goal: Respiratory status will improve Outcome: Progressing   Problem: Skin Integrity: Goal: Demonstrates signs of wound healing without infection Outcome: Progressing   Problem: Urinary Elimination: Goal: Will remain free from infection Outcome: Progressing Goal: Ability to achieve and maintain adequate urine output Outcome: Progressing   Problem: Education: Goal: Knowledge of the prescribed therapeutic regimen will improve Outcome: Progressing Goal: Understanding of discharge needs will improve Outcome: Progressing Goal: Individualized Educational Video(s) Outcome: Progressing   Problem: Activity: Goal: Ability to avoid complications of mobility impairment will improve Outcome: Progressing Goal: Ability to tolerate increased activity will improve Outcome: Progressing   Problem: Clinical Measurements: Goal: Postoperative complications will be avoided or minimized Outcome: Progressing   Problem: Pain Management: Goal: Pain level will decrease with appropriate interventions Outcome: Progressing   Problem: Skin Integrity: Goal: Will show signs of wound healing Outcome: Progressing   "

## 2024-06-07 NOTE — Op Note (Signed)
 "     OPERATIVE REPORT- TOTAL HIP ARTHROPLASTY   PREOPERATIVE DIAGNOSIS: Osteoarthritis of the Left hip.   POSTOPERATIVE DIAGNOSIS: Osteoarthritis of the Left  hip.   PROCEDURE: Left total hip arthroplasty, anterior approach.   SURGEON: Dempsey Moan, MD   ASSISTANT: Waddell Sor, PA-C  ANESTHESIA:  Spinal  ESTIMATED BLOOD LOSS:-250 mL    DRAINS: None  COMPLICATIONS: None   CONDITION: PACU - hemodynamically stable.   BRIEF CLINICAL NOTE: Diana Fritz is a 79 y.o. female who has advanced end-  stage arthritis of their Left  hip with progressively worsening pain and  dysfunction.The patient has failed nonoperative management and presents for  total hip arthroplasty.   PROCEDURE IN DETAIL: After successful administration of spinal  anesthetic, the traction boots for the [facility] bed were placed on both  feet and the patient was placed onto the [facility] bed, boots placed into the leg  holders. The Left hip was then isolated from the perineum with plastic  drapes and prepped and draped in the usual sterile fashion. ASIS and  greater trochanter were marked and a oblique incision was made, starting  at about 1 cm lateral and 2 cm distal to the ASIS and coursing towards  the anterior cortex of the femur. The skin was cut with a 10 blade  through subcutaneous tissue to the level of the fascia overlying the  tensor fascia lata muscle. The fascia was then incised in line with the  incision at the junction of the anterior third and posterior 2/3rd. The  muscle was teased off the fascia and then the interval between the TFL  and the rectus was developed. The Hohmann retractor was then placed at  the top of the femoral neck over the capsule. The vessels overlying the  capsule were cauterized and the fat on top of the capsule was removed.  A Hohmann retractor was then placed anterior underneath the rectus  femoris to give exposure to the entire anterior capsule. A T-shaped  capsulotomy  was performed. The edges were tagged and the femoral head  was identified.       Osteophytes are removed off the superior acetabulum.  The femoral neck was then cut in situ with an oscillating saw. Traction  was then applied to the left lower extremity utilizing the [facility]  traction. The femoral head was then removed. Retractors were placed  around the acetabulum and then circumferential removal of the labrum was  performed. Osteophytes were also removed. Reaming starts at 47 mm to  medialize and  Increased in 2 mm increments to 51 mm. We reamed in  approximately 40 degrees of abduction, 20 degrees anteversion. A 52 mm  pinnacle acetabular shell was then impacted in anatomic position under  fluoroscopic guidance with excellent purchase. We did not need to place  any additional dome screws. A 32 mm neutral + 4 Altrx liner was then  placed into the acetabular shell.       The femoral lift was then placed along the lateral aspect of the femur  just distal to the vastus ridge. The leg was  externally rotated and capsule  was stripped off the inferior aspect of the femoral neck down to the  level of the lesser trochanter, this was done with electrocautery. The femur was lifted after this was performed. The  leg was then placed in an extended and adducted position essentially delivering the femur. We also removed the capsule superiorly and the piriformis from the piriformis fossa  to gain excellent exposure of the  proximal femur. Rongeur was used to remove some cancellous bone to get  into the lateral portion of the proximal femur for placement of the  initial starter reamer. The starter broaches was placed  the starter broach  and was shown to go down the center of the canal. Broaching  with the Actis system was then performed starting at size 0  coursing  Up to size 7. A size 7 had excellent torsional and rotational  and axial stability. The trial standard offset neck was then placed  with a 32 +  5 trial head. The hip was then reduced. We confirmed that  the stem was in the canal both on AP and lateral x-rays. It also has excellent sizing. The hip was reduced with outstanding stability through full extension and full external rotation.. AP pelvis was taken and the leg lengths were measured and found to be equal. Hip was then dislocated again and the femoral head and neck removed. The  femoral broach was removed. Size 7 Actis stem with a standard offset  neck was then impacted into the femur following native anteversion. Has  excellent purchase in the canal. Excellent torsional and rotational and  axial stability. It is confirmed to be in the canal on AP and lateral  fluoroscopic views. The 32 + 5 metal head was placed and the hip  reduced with outstanding stability. Again AP pelvis was taken and it  confirmed that the leg lengths were equal. The wound was then copiously  irrigated with saline solution and the capsule reattached and repaired  with Ethibond suture. 30 ml of .25% Bupivicaine was  injected into the capsule and into the edge of the tensor fascia lata as well as subcutaneous tissue. The fascia overlying the tensor fascia lata was then closed with a running #1 V-Loc. Subcu was closed with interrupted 2-0 Vicryl and subcuticular running 4-0 Monocryl. Incision was cleaned  and dried. Steri-Strips and a bulky sterile dressing applied. The patient was awakened and transported to  recovery in stable condition.        Please note that a surgical assistant was a medical necessity for this procedure to perform it in a safe and expeditious manner. Assistant was necessary to provide appropriate retraction of vital neurovascular structures and to prevent femoral fracture and allow for anatomic placement of the prosthesis.  Dempsey Moan, M.D.    "

## 2024-06-07 NOTE — Interval H&P Note (Signed)
 History and Physical Interval Note:  06/07/2024 8:07 AM  Diana Fritz  has presented today for surgery, with the diagnosis of Left hip osteoarthritis.  The various methods of treatment have been discussed with the patient and family. After consideration of risks, benefits and other options for treatment, the patient has consented to  Procedures: ARTHROPLASTY, HIP, TOTAL, ANTERIOR APPROACH (Left) as a surgical intervention.  The patient's history has been reviewed, patient examined, no change in status, stable for surgery.  I have reviewed the patient's chart and labs.  Questions were answered to the patient's satisfaction.     Dempsey Terrin Imparato

## 2024-06-08 ENCOUNTER — Encounter (HOSPITAL_COMMUNITY): Payer: Self-pay | Admitting: Orthopedic Surgery

## 2024-06-08 DIAGNOSIS — M1612 Unilateral primary osteoarthritis, left hip: Secondary | ICD-10-CM | POA: Diagnosis not present

## 2024-06-08 LAB — BASIC METABOLIC PANEL WITH GFR
Anion gap: 10 (ref 5–15)
BUN: 12 mg/dL (ref 8–23)
CO2: 25 mmol/L (ref 22–32)
Calcium: 9.1 mg/dL (ref 8.9–10.3)
Chloride: 103 mmol/L (ref 98–111)
Creatinine, Ser: 0.8 mg/dL (ref 0.44–1.00)
GFR, Estimated: >60 mL/min
Glucose, Bld: 163 mg/dL — ABNORMAL HIGH (ref 70–99)
Potassium: 4.3 mmol/L (ref 3.5–5.1)
Sodium: 138 mmol/L (ref 135–145)

## 2024-06-08 LAB — CBC
HCT: 31.5 % — ABNORMAL LOW (ref 36.0–46.0)
Hemoglobin: 9.9 g/dL — ABNORMAL LOW (ref 12.0–15.0)
MCH: 31.3 pg (ref 26.0–34.0)
MCHC: 31.4 g/dL (ref 30.0–36.0)
MCV: 99.7 fL (ref 80.0–100.0)
Platelets: 302 K/uL (ref 150–400)
RBC: 3.16 MIL/uL — ABNORMAL LOW (ref 3.87–5.11)
RDW: 15.4 % (ref 11.5–15.5)
WBC: 10.9 K/uL — ABNORMAL HIGH (ref 4.0–10.5)
nRBC: 0 % (ref 0.0–0.2)

## 2024-06-08 MED ORDER — METHOCARBAMOL 500 MG PO TABS: 500.0000 mg | ORAL_TABLET | Freq: Four times a day (QID) | ORAL | 0 refills | Status: AC | PRN

## 2024-06-08 MED ORDER — HYDROCODONE-ACETAMINOPHEN 5-325 MG PO TABS: 1.0000 | ORAL_TABLET | ORAL | 0 refills | Status: AC | PRN

## 2024-06-08 MED ORDER — ASPIRIN 81 MG PO CHEW: 81.0000 mg | CHEWABLE_TABLET | Freq: Two times a day (BID) | ORAL | 0 refills | Status: AC

## 2024-06-08 MED ORDER — ONDANSETRON HCL 4 MG PO TABS: 4.0000 mg | ORAL_TABLET | Freq: Four times a day (QID) | ORAL | 0 refills | Status: AC | PRN

## 2024-06-08 MED ORDER — TRAMADOL HCL 50 MG PO TABS: 50.0000 mg | ORAL_TABLET | Freq: Four times a day (QID) | ORAL | 0 refills | Status: AC | PRN

## 2024-06-08 NOTE — TOC Transition Note (Signed)
 Transition of Care [facility]) - Discharge Note   Patient Details  Name: Diana Fritz MRN: [id number] Date of Birth: [birth year]  [facility] CM/SW Contact:  Alfonse JONELLE Rex, RN Phone Number: 06/08/2024, 9:55 AM   Clinical Narrative:   Met with patient at bedside to review dc therapy and home equipment needs, pt confirmed HEP, has RW. No CM needs     Final next level of care: Home/Self Care Barriers to Discharge: No Barriers Identified   Patient Goals and CMS Choice Patient states their goals for this hospitalization and ongoing recovery are:: return home          Discharge Placement                       Discharge Plan and Services Additional resources added to the After Visit Summary for                                       Social Drivers of Health (SDOH) Interventions SDOH Screenings   Food Insecurity: No Food Insecurity (06/07/2024)  Housing: Low Risk (06/07/2024)  Transportation Needs: No Transportation Needs (06/07/2024)  Utilities: Not At Risk (06/07/2024)  Social Connections: Moderately Integrated (06/07/2024)  Tobacco Use: Low Risk (06/07/2024)     Readmission Risk Interventions     No data to display

## 2024-06-08 NOTE — Discharge Summary (Signed)
 Patient ID: LAZETTE ESTALA MRN: [id number] DOB/AGE: 78-Jun-1947 77 y.o.  Admit date: 06/07/2024 Discharge date: 06/08/2024  Admission Diagnoses:  Principal Problem:   OA (osteoarthritis) of hip Active Problems:   Primary osteoarthritis of left hip   Discharge Diagnoses:  Same  Past Medical History:  Diagnosis Date   Anemia    Arthritis    Glaucoma    Hypertension    Hypothyroidism    Sciatic leg pain    Thyroid  cancer (HCC)     Surgeries: Procedures: ARTHROPLASTY, HIP, TOTAL, ANTERIOR APPROACH on 06/07/2024   Consultants:   Discharged Condition: Improved  Hospital Course: JARED CAHN is an 79 y.o. female who was admitted 06/07/2024 for operative treatment ofOA (osteoarthritis) of hip. Patient has severe unremitting pain that affects sleep, daily activities, and work/hobbies. After pre-op clearance the patient was taken to the operating room on 06/07/2024 and underwent  Procedures: ARTHROPLASTY, HIP, TOTAL, ANTERIOR APPROACH.    Patient was given perioperative antibiotics:  Anti-infectives (From admission, onward)    Start     Dose/Rate Route Frequency Ordered Stop   06/07/24 1600  ceFAZolin  (ANCEF ) IVPB 2g/100 mL premix        2 g 200 mL/hr over 30 Minutes Intravenous Every 6 hours 06/07/24 1329 06/07/24 2212   06/07/24 0800  ceFAZolin  (ANCEF ) IVPB 2g/100 mL premix        2 g 200 mL/hr over 30 Minutes Intravenous On call to O.R. 06/07/24 0751 06/07/24 0945        Patient was given sequential compression devices, early ambulation, and chemoprophylaxis to prevent DVT.  Patient benefited maximally from hospital stay and there were no complications.    Recent vital signs: Patient Vitals for the past 24 hrs:  BP Temp Temp src Pulse Resp SpO2  06/08/24 1322 (!) 126/58 98.3 F (36.8 C) Oral 68 17 97 %  06/08/24 0934 136/64 98.4 F (36.9 C) Oral 74 17 93 %  06/08/24 0207 107/61 98.2 F (36.8 C) Oral 71 14 98 %  06/07/24 2120 131/68 (!) 97.4 F (36.3 C) Oral 70  15 100 %     Recent laboratory studies:  Recent Labs    06/08/24 0346  WBC 10.9*  HGB 9.9*  HCT 31.5*  PLT 302  NA 138  K 4.3  CL 103  CO2 25  BUN 12  CREATININE 0.80  GLUCOSE 163*  CALCIUM 9.1     Discharge Medications:   Allergies as of 06/08/2024   No Known Allergies      Medication List     TAKE these medications    aspirin  81 MG chewable tablet Chew 1 tablet (81 mg total) by mouth 2 (two) times daily for 21 days. Then take one 81 mg aspirin  once a day for three weeks. Then discontinue aspirin .   chlorhexidine  4 % external liquid Commonly known as: HIBICLENS  Apply 15 mLs (1 Application total) topically as directed for 30 doses. Use as directed daily for 5 days every other week for 6 weeks.   dorzolamide -timolol  2-0.5 % ophthalmic solution Commonly known as: COSOPT  Place 1 drop into the left eye 2 (two) times daily.   HYDROcodone -acetaminophen  5-325 MG tablet Commonly known as: NORCO/VICODIN Take 1 tablet by mouth every 4 (four) hours as needed for severe pain (pain score 7-10).   levothyroxine  88 MCG tablet Commonly known as: SYNTHROID  Take 88 mcg by mouth daily before breakfast.   lisinopril-hydrochlorothiazide  10-12.5 MG tablet Commonly known as: ZESTORETIC Take 0.5 tablets by mouth in  the morning.   methocarbamol  500 MG tablet Commonly known as: ROBAXIN  Take 1 tablet (500 mg total) by mouth every 6 (six) hours as needed for muscle spasms.   mupirocin  ointment 2 % Commonly known as: BACTROBAN  Place 1 Application into the nose 2 (two) times daily for 60 doses. Use as directed 2 times daily for 5 days every other week for 6 weeks.   ondansetron  4 MG tablet Commonly known as: ZOFRAN  Take 1 tablet (4 mg total) by mouth every 6 (six) hours as needed for nausea.   simvastatin  20 MG tablet Commonly known as: ZOCOR  Take 20 mg by mouth at bedtime.   Systane Complete 0.6 % Soln Generic drug: Propylene Glycol Place 1 drop into the right eye 3  (three) times daily as needed (for irritation).   traMADol  50 MG tablet Commonly known as: Ultram  Take 1 tablet (50 mg total) by mouth every 6 (six) hours as needed for moderate pain (pain score 4-6).   Tylenol  8 Hour 650 MG CR tablet Generic drug: acetaminophen  Take 650-1,300 mg by mouth See admin instructions. Take 1,300 mg by mouth in the morning & at bedtime and an additional 650-1,300 mg once a day AS NEEDED for unresolved pain               Discharge Care Instructions  (From admission, onward)           Start     Ordered   06/08/24 0000  Weight bearing as tolerated        06/08/24 0821   06/08/24 0000  Change dressing       Comments: You have an adhesive waterproof bandage over the incision. Leave this in place until your first follow-up appointment. Once you remove this you will not need to place another bandage.   06/08/24 9178            Diagnostic Studies: DG Pelvis Portable Result Date: 06/07/2024 CLINICAL DATA:  Postop. EXAM: PORTABLE PELVIS 1-2 VIEWS COMPARISON:  None Available. FINDINGS: Left hip arthroplasty in expected alignment. No periprosthetic lucency or fracture. Recent postsurgical change includes air and edema in the soft tissues. Previous right hip arthroplasty. IMPRESSION: Left hip arthroplasty without immediate postoperative complication. Electronically Signed   By: Andrea Gasman M.D.   On: 06/07/2024 13:27   DG HIP UNILAT WITH PELVIS 1V LEFT Result Date: 06/07/2024 CLINICAL DATA:  Left hip arthroplasty. EXAM: DG HIP (WITH OR WITHOUT PELVIS) 1V*L* COMPARISON:  Pelvic radiograph dated 06/07/2024 FINDINGS: Twenty truck. Fluoroscopic spot images provided. The total fluoroscopic time is 5 seconds with a cumulative air Karma of 0.77 mm. Bilateral total hip arthroplasties noted. IMPRESSION: Fluoroscopic spot images of the left hip arthroplasty. Electronically Signed   By: Vanetta Chou M.D.   On: 06/07/2024 12:15   DG C-Arm 1-60 Min-No  Report Result Date: 06/07/2024 Fluoroscopy was utilized by the requesting physician.  No radiographic interpretation.    Disposition: Discharge disposition: 01-Home or Self Care       Discharge Instructions     Call MD / Call 911   Complete by: As directed    If you experience chest pain or shortness of breath, CALL 911 and be transported to the hospital emergency room.  If you develope a fever above 101 F, pus (white drainage) or increased drainage or redness at the wound, or calf pain, call your surgeon's office.   Change dressing   Complete by: As directed    You have an adhesive waterproof bandage  over the incision. Leave this in place until your first follow-up appointment. Once you remove this you will not need to place another bandage.   Constipation Prevention   Complete by: As directed    Drink plenty of fluids.  Prune juice may be helpful.  You may use a stool softener, such as Colace (over the counter) 100 mg twice a day.  Use MiraLax  (over the counter) for constipation as needed.   Diet - low sodium heart healthy   Complete by: As directed    Do not sit on low chairs, stoools or toilet seats, as it may be difficult to get up from low surfaces   Complete by: As directed    Driving restrictions   Complete by: As directed    No driving for two weeks   Post-operative opioid taper instructions:   Complete by: As directed    POST-OPERATIVE OPIOID TAPER INSTRUCTIONS: It is important to wean off of your opioid medication as soon as possible. If you do not need pain medication after your surgery it is ok to stop day one. Opioids include: Codeine, Hydrocodone (Norco, Vicodin), Oxycodone (Percocet, oxycontin ) and hydromorphone  amongst others.  Long term and even short term use of opiods can cause: Increased pain response Dependence Constipation Depression Respiratory depression And more.  Withdrawal symptoms can include Flu like symptoms Nausea, vomiting And  more Techniques to manage these symptoms Hydrate well Eat regular healthy meals Stay active Use relaxation techniques(deep breathing, meditating, yoga) Do Not substitute Alcohol  to help with tapering If you have been on opioids for less than two weeks and do not have pain than it is ok to stop all together.  Plan to wean off of opioids This plan should start within one week post op of your joint replacement. Maintain the same interval or time between taking each dose and first decrease the dose.  Cut the total daily intake of opioids by one tablet each day Next start to increase the time between doses. The last dose that should be eliminated is the evening dose.      TED hose   Complete by: As directed    Use stockings (TED hose) for three weeks on both leg(s).  You may remove them at night for sleeping.   Weight bearing as tolerated   Complete by: As directed         Follow-up Information     Aluisio, Dempsey, MD. Schedule an appointment as soon as possible for a visit in 2 week(s).   Specialty: Orthopedic Surgery Contact information: [address] [place] [address] [phone]                  Signed: Waddell DELENA Sor 06/08/2024, 4:46 PM

## 2024-06-08 NOTE — Care Management Important Message (Signed)
 Important Message  Patient Details  Name: Diana Fritz MRN: [id number] Date of Birth: [birth year]   Important Message Given:        Alfonse JONELLE Rex, RN 06/08/2024, 11:43 AM

## 2024-06-08 NOTE — Progress Notes (Signed)
 Physical Therapy Treatment Patient Details Name: Diana Fritz MRN: [id number] DOB: 1946-04-27 Today's Date: 06/08/2024   History of Present Illness Pt is 79 yo female admitted on 06/07/24 for L THA.  Pt with hx including but not limited to anemia, glaucoma, sciatica, and arthritis.    PT Comments  Pt ambulated short distance and performed a few LE exercises.  Pt encouraged to stay OOB and sit in recliner end of session.      If plan is discharge home, recommend the following: A little help with walking and/or transfers;A little help with bathing/dressing/bathroom;Assistance with cooking/housework;Help with stairs or ramp for entrance   Can travel by private vehicle        Equipment Recommendations  None recommended by PT    Recommendations for Other Services       Precautions / Restrictions Precautions Precautions: Fall Restrictions LLE Weight Bearing Per Provider Order: Weight bearing as tolerated     Mobility  Bed Mobility Overal bed mobility: Needs Assistance Bed Mobility: Supine to Sit     Supine to sit: Contact guard, HOB elevated     General bed mobility comments: verbal cues for self assist of L LE    Transfers Overall transfer level: Needs assistance Equipment used: Rolling walker (2 wheels) Transfers: Sit to/from Stand Sit to Stand: Min assist           General transfer comment: light assist to rise and stabilize, verbal cues for UE and LE positioning for pain control    Ambulation/Gait Ambulation/Gait assistance: Contact guard assist Gait Distance (Feet): 30 Feet Assistive device: Rolling walker (2 wheels) Gait Pattern/deviations: Step-to pattern, Decreased stride length, Trunk flexed Gait velocity: decreased     General Gait Details: verbal cues for sequence, RW positioning, step length; distance to tolerance   Stairs             Wheelchair Mobility     Tilt Bed    Modified Rankin (Stroke Patients Only)       Balance                                             Communication Communication Communication: No apparent difficulties  Cognition Arousal: Alert Behavior During Therapy: WFL for tasks assessed/performed   PT - Cognitive impairments: No apparent impairments                                Cueing    Exercises Total Joint Exercises Ankle Circles/Pumps: AROM, Both, 10 reps Quad Sets: AROM, Both, 10 reps Heel Slides: AAROM, Left, 5 reps Hip ABduction/ADduction: AAROM, Left, 5 reps Long Arc Quad: AROM, Left, 10 reps, Seated    General Comments        Pertinent Vitals/Pain Pain Assessment Pain Assessment: 0-10 Pain Score: 8  Pain Location: L hip Pain Descriptors / Indicators: Aching, Sore Pain Intervention(s): Repositioned, Monitored during session, Premedicated before session    Home Living                          Prior Function            PT Goals (current goals can now be found in the care plan section) Progress towards PT goals: Progressing toward goals    Frequency    7X/week  PT Plan      Co-evaluation              AM-PAC PT 6 Clicks Mobility   Outcome Measure  Help needed turning from your back to your side while in a flat bed without using bedrails?: A Little Help needed moving from lying on your back to sitting on the side of a flat bed without using bedrails?: A Little Help needed moving to and from a bed to a chair (including a wheelchair)?: A Little Help needed standing up from a chair using your arms (e.g., wheelchair or bedside chair)?: A Little Help needed to walk in hospital room?: A Little Help needed climbing 3-5 steps with a railing? : A Lot 6 Click Score: 17    End of Session Equipment Utilized During Treatment: Gait belt Activity Tolerance: Patient tolerated treatment well Patient left: in chair;with call bell/phone within reach Nurse Communication: Mobility status PT Visit Diagnosis: Other  abnormalities of gait and mobility (R26.89);Muscle weakness (generalized) (M62.81)     Time: 8893-8875 PT Time Calculation (min) (ACUTE ONLY): 18 min  Charges:    $Therapeutic Exercise: 8-22 mins PT General Charges $$ ACUTE PT VISIT: 1 Visit                    Tari PT, DPT Physical Therapist Acute Rehabilitation Services Office: [phone]    Kati L Payson 06/08/2024, 1:15 PM

## 2024-06-08 NOTE — Progress Notes (Signed)
" ° °  Subjective: 1 Day Post-Op Procedures (LRB): ARTHROPLASTY, HIP, TOTAL, ANTERIOR APPROACH (Left) Patient reports pain as mild.   Patient seen in rounds by Dr. Melodi. Patient is well, and has had no acute complaints or problems. Denies chest pain or SOB. No issues overnight. Foley catheter removed this AM. We will continue therapy today, ambulated 20' yesterday.   Objective: Vital signs in last 24 hours: Temp:  [97.2 F (36.2 C)-98.2 F (36.8 C)] 98.2 F (36.8 C) (01/22 0207) Pulse Rate:  [41-72] 71 (01/22 0207) Resp:  [12-17] 14 (01/22 0207) BP: (100-154)/(61-116) 107/61 (01/22 0207) SpO2:  [89 %-100 %] 98 % (01/22 0207)  Intake/Output from previous day:  Intake/Output Summary (Last 24 hours) at 06/08/2024 0812 Last data filed at 06/08/2024 0645 Gross per 24 hour  Intake 2513.76 ml  Output 2300 ml  Net 213.76 ml     Intake/Output this shift: No intake/output data recorded.  Labs: Recent Labs    06/08/24 0346  HGB 9.9*   Recent Labs    06/08/24 0346  WBC 10.9*  RBC 3.16*  HCT 31.5*  PLT 302   Recent Labs    06/08/24 0346  NA 138  K 4.3  CL 103  CO2 25  BUN 12  CREATININE 0.80  GLUCOSE 163*  CALCIUM 9.1   No results for input(s): LABPT, INR in the last 72 hours.  Exam: General - Patient is Alert and Oriented Extremity - Neurologically intact Neurovascular intact Sensation intact distally Dorsiflexion/Plantar flexion intact Dressing - dressing C/D/I Motor Function - intact, moving foot and toes well on exam.   Past Medical History:  Diagnosis Date   Anemia    Arthritis    Glaucoma    Hypertension    Hypothyroidism    Sciatic leg pain    Thyroid  cancer (HCC)     Assessment/Plan: 1 Day Post-Op Procedures (LRB): ARTHROPLASTY, HIP, TOTAL, ANTERIOR APPROACH (Left) Principal Problem:   OA (osteoarthritis) of hip Active Problems:   Primary osteoarthritis of left hip  Estimated body mass index is 29.25 kg/m as calculated from the  following:   Height as of this encounter: 5' 5 (1.651 m).   Weight as of this encounter: 79.7 kg. Advance diet Up with therapy D/C IV fluids  DVT Prophylaxis - Aspirin  Weight bearing as tolerated. Continue therapy.  Plan is to go Home after hospital stay. Plan for discharge with HEP later today if progresses with therapy and meeting goals. Follow-up in the office in 2 weeks.  The PDMP database was reviewed today prior to any opioid medications being prescribed to this patient.  Waddell Sor, PA-C Orthopedic Surgery [phone] 06/08/2024, 8:12 AM  "

## 2024-06-08 NOTE — Progress Notes (Signed)
 Physical Therapy Treatment Patient Details Name: Diana Fritz MRN: [id number] DOB: August 14, 1945 Today's Date: 06/08/2024   History of Present Illness Pt is 79 yo female admitted on 06/07/24 for L THA.  Pt with hx including but not limited to anemia, glaucoma, sciatica, and arthritis.    PT Comments  Pt ambulated in hallway and performing better this afternoon.  Verbally reviewed HEP with pt and demonstrated standing exercises.  Pt reports understanding and feels ready for d/c home today.     If plan is discharge home, recommend the following: A little help with walking and/or transfers;A little help with bathing/dressing/bathroom;Assistance with cooking/housework;Help with stairs or ramp for entrance   Can travel by private vehicle        Equipment Recommendations  None recommended by PT    Recommendations for Other Services       Precautions / Restrictions Precautions Precautions: Fall Restrictions LLE Weight Bearing Per Provider Order: Weight bearing as tolerated     Mobility  Bed Mobility Overal bed mobility: Needs Assistance Bed Mobility: Supine to Sit     Supine to sit: Contact guard, HOB elevated     General bed mobility comments: pt in recliner    Transfers Overall transfer level: Needs assistance Equipment used: Rolling walker (2 wheels) Transfers: Sit to/from Stand Sit to Stand: Contact guard assist           General transfer comment: verbal cues for UE and LE positioning for pain control    Ambulation/Gait Ambulation/Gait assistance: Contact guard assist Gait Distance (Feet): 120 Feet Assistive device: Rolling walker (2 wheels) Gait Pattern/deviations: Step-to pattern, Decreased stride length, Trunk flexed Gait velocity: decreased     General Gait Details: verbal cues for sequence, RW positioning, step length; improved distance this afternoon   Stairs             Wheelchair Mobility     Tilt Bed    Modified Rankin (Stroke  Patients Only)       Balance                                            Communication Communication Communication: No apparent difficulties  Cognition Arousal: Alert Behavior During Therapy: WFL for tasks assessed/performed   PT - Cognitive impairments: No apparent impairments                                Cueing    Exercises Total Joint Exercises     General Comments        Pertinent Vitals/Pain Pain Assessment Pain Assessment: 0-10 Pain Score: 6  Pain Location: L hip Pain Descriptors / Indicators: Aching, Sore Pain Intervention(s): Repositioned, Monitored during session    Home Living                          Prior Function            PT Goals (current goals can now be found in the care plan section) Progress towards PT goals: Progressing toward goals    Frequency    7X/week      PT Plan      Co-evaluation              AM-PAC PT 6 Clicks Mobility   Outcome Measure  Help needed  turning from your back to your side while in a flat bed without using bedrails?: A Little Help needed moving from lying on your back to sitting on the side of a flat bed without using bedrails?: A Little Help needed moving to and from a bed to a chair (including a wheelchair)?: A Little Help needed standing up from a chair using your arms (e.g., wheelchair or bedside chair)?: A Little Help needed to walk in hospital room?: A Little Help needed climbing 3-5 steps with a railing? : A Little 6 Click Score: 18    End of Session Equipment Utilized During Treatment: Gait belt Activity Tolerance: Patient tolerated treatment well Patient left: in chair;with call bell/phone within reach;with family/visitor present Nurse Communication: Mobility status PT Visit Diagnosis: Other abnormalities of gait and mobility (R26.89);Muscle weakness (generalized) (M62.81)     Time: 1501-1520 PT Time Calculation (min) (ACUTE ONLY): 19  min  Charges:    $Gait Training: 8-22 mins PT General Charges $$ ACUTE PT VISIT: 1 Visit                     Tari KLEIN, DPT Physical Therapist Acute Rehabilitation Services Office: [phone]    Tari CROME Payson 06/08/2024, 4:32 PM

## 2024-06-08 NOTE — Care Management Obs Status (Signed)
 MEDICARE OBSERVATION STATUS NOTIFICATION   Patient Details  Name: Diana Fritz MRN: [id number] Date of Birth: 1945/06/01   Medicare Observation Status Notification Given:  Yes    Alfonse JONELLE Rex, RN 06/08/2024, 2:33 PM
